# Patient Record
Sex: Female | Born: 1980 | Race: Black or African American | Hispanic: No | Marital: Single | Smoking: Never smoker
Health system: Southern US, Community
[De-identification: ages and names within clinical notes are randomized; demographics above are authoritative.]

## PROBLEM LIST (undated history)

## (undated) ENCOUNTER — Inpatient Hospital Stay (HOSPITAL_COMMUNITY): Payer: Self-pay

## (undated) DIAGNOSIS — Z8619 Personal history of other infectious and parasitic diseases: Secondary | ICD-10-CM

## (undated) DIAGNOSIS — Z86018 Personal history of other benign neoplasm: Secondary | ICD-10-CM

## (undated) DIAGNOSIS — R768 Other specified abnormal immunological findings in serum: Secondary | ICD-10-CM

## (undated) DIAGNOSIS — B009 Herpesviral infection, unspecified: Secondary | ICD-10-CM

## (undated) DIAGNOSIS — R87619 Unspecified abnormal cytological findings in specimens from cervix uteri: Secondary | ICD-10-CM

## (undated) DIAGNOSIS — IMO0002 Reserved for concepts with insufficient information to code with codable children: Secondary | ICD-10-CM

## (undated) HISTORY — DX: Personal history of other infectious and parasitic diseases: Z86.19

## (undated) HISTORY — DX: Unspecified abnormal cytological findings in specimens from cervix uteri: R87.619

## (undated) HISTORY — PX: DILATION AND CURETTAGE OF UTERUS: SHX78

## (undated) HISTORY — PX: WISDOM TOOTH EXTRACTION: SHX21

## (undated) HISTORY — DX: Other specified abnormal immunological findings in serum: R76.8

## (undated) HISTORY — DX: Personal history of other benign neoplasm: Z86.018

## (undated) HISTORY — DX: Reserved for concepts with insufficient information to code with codable children: IMO0002

## (undated) HISTORY — PX: COLPOSCOPY: SHX161

## (undated) HISTORY — DX: Herpesviral infection, unspecified: B00.9

---

## 2005-02-23 ENCOUNTER — Other Ambulatory Visit: Admission: RE | Admit: 2005-02-23 | Discharge: 2005-02-23 | Payer: Self-pay | Admitting: Obstetrics and Gynecology

## 2005-03-30 ENCOUNTER — Other Ambulatory Visit: Admission: RE | Admit: 2005-03-30 | Discharge: 2005-03-30 | Payer: Self-pay | Admitting: Obstetrics and Gynecology

## 2005-08-23 ENCOUNTER — Other Ambulatory Visit: Admission: RE | Admit: 2005-08-23 | Discharge: 2005-08-23 | Payer: Self-pay | Admitting: Obstetrics and Gynecology

## 2006-03-11 ENCOUNTER — Other Ambulatory Visit: Admission: RE | Admit: 2006-03-11 | Discharge: 2006-03-11 | Payer: Self-pay | Admitting: Obstetrics and Gynecology

## 2006-07-04 ENCOUNTER — Emergency Department (HOSPITAL_COMMUNITY): Admission: EM | Admit: 2006-07-04 | Discharge: 2006-07-05 | Payer: Self-pay | Admitting: Emergency Medicine

## 2010-07-26 ENCOUNTER — Inpatient Hospital Stay (HOSPITAL_COMMUNITY): Payer: Federal, State, Local not specified - PPO

## 2010-07-26 ENCOUNTER — Encounter (HOSPITAL_COMMUNITY): Payer: Self-pay | Admitting: Radiology

## 2010-07-26 ENCOUNTER — Inpatient Hospital Stay (HOSPITAL_COMMUNITY)
Admission: AD | Admit: 2010-07-26 | Discharge: 2010-07-26 | Disposition: A | Discharge status: Home / Self Care | Payer: Federal, State, Local not specified - PPO | Source: Ambulatory Visit | Attending: Obstetrics and Gynecology | Admitting: Obstetrics and Gynecology

## 2010-07-26 DIAGNOSIS — O209 Hemorrhage in early pregnancy, unspecified: Secondary | ICD-10-CM | POA: Insufficient documentation

## 2010-07-26 DIAGNOSIS — R58 Hemorrhage, not elsewhere classified: Secondary | ICD-10-CM

## 2010-07-26 LAB — CBC
MCH: 30.8 pg (ref 26.0–34.0)
MCV: 92.6 fL (ref 78.0–100.0)
RDW: 12.8 % (ref 11.5–15.5)

## 2010-07-26 LAB — HCG, QUANTITATIVE, PREGNANCY: hCG, Beta Chain, Quant, S: 177911 m[IU]/mL — ABNORMAL HIGH (ref <5)

## 2010-08-30 ENCOUNTER — Other Ambulatory Visit: Payer: Self-pay | Admitting: Obstetrics and Gynecology

## 2010-08-30 ENCOUNTER — Inpatient Hospital Stay (HOSPITAL_COMMUNITY): Payer: Federal, State, Local not specified - PPO

## 2010-08-30 ENCOUNTER — Ambulatory Visit (HOSPITAL_COMMUNITY)
Admission: AD | Admit: 2010-08-30 | Discharge: 2010-08-30 | Disposition: A | Discharge status: Home / Self Care | Payer: Federal, State, Local not specified - PPO | Source: Ambulatory Visit | Attending: Obstetrics and Gynecology | Admitting: Obstetrics and Gynecology

## 2010-08-30 DIAGNOSIS — O034 Incomplete spontaneous abortion without complication: Secondary | ICD-10-CM | POA: Insufficient documentation

## 2010-08-30 LAB — CBC
HCT: 33.6 % — ABNORMAL LOW (ref 36.0–46.0)
Hemoglobin: 11.1 g/dL — ABNORMAL LOW (ref 12.0–15.0)
MCHC: 33 g/dL (ref 30.0–36.0)
Platelets: 266 10*3/uL (ref 150–400)
WBC: 19 10*3/uL — ABNORMAL HIGH (ref 4.0–10.5)

## 2010-08-30 LAB — DIFFERENTIAL
Basophils Absolute: 0 10*3/uL (ref 0.0–0.1)
Basophils Relative: 0 % (ref 0–1)
Eosinophils Absolute: 0 10*3/uL (ref 0.0–0.7)
Lymphocytes Relative: 7 % — ABNORMAL LOW (ref 12–46)
Monocytes Absolute: 1 10*3/uL (ref 0.1–1.0)

## 2010-09-06 NOTE — Op Note (Signed)
NAMEJANELLI, WELLING NO.:  [id number]  MEDICAL RECORD NO.:  [id number]           PATIENT TYPE:  O  LOCATION:  [facility]                          FACILITY:  [facility]  PHYSICIAN:  Janine Limbo, M.D.DATE OF BIRTH:  09/30/1980  DATE OF PROCEDURE:  08/30/2010 DATE OF DISCHARGE:                              OPERATIVE REPORT   PREOPERATIVE DIAGNOSIS:  Retained products of conception after a twin spontaneous abortion at 14 weeks and 4 days.  POSTOPERATIVE DIAGNOSIS:  Retained products of conception after a twin spontaneous abortion at 14 weeks and 4 days.  PROCEDURE:  Suction dilatation and evacuation.  SURGEON:  Janine Limbo, MD  FIRST ASSISTANT:  None.  ANESTHETIC:  Monitored anesthetic control and paracervical block using 0.5% Marcaine with epinephrine.  DISPOSITION:  Norma Schmidt is a 30 year old female, gravida 1, para 0, who presents at 14 weeks and 4 days' gestation.  The patient has been followed at the [facility] and Gy[facility]cology Division of [facility].  The patient reports spontaneously passing both twins in the bathroom this morning.  She has had some bleeding and cramping since that time.  An ultrasound was performed and it showed retained products of conception.  The patient understands the indications for her surgical procedure as well as her alternative treatment options.  The risk and benefits of each of those options were reviewed.  The patient accepts the risks of, but not limited to, anesthetic complications, bleeding, infections, and possible damage to the surrounding organs associated with dilatation and evacuation.  FINDINGS:  The patient is blood type is B positive.  A moderate amount of placental tissue was removed from within the uterine cavity.  The uterus sounded to 12 cm.  No adnexal masses were appreciated on exam under anesthesia.  DESCRIPTION OF PROCEDURE:  The patient was taken to the  operating room where she was given medication through her IV line.  The patient's perineum and vagina were prepped with multiple layers of Betadine.  The bladder was drained of urine.  The patient was then sterilely draped.  A paracervical block was placed using 10 mL of 0.5% Marcaine with epinephrine.  The cervix was already dilated.  The uterine cavity was evacuated using a size 12 suction curette followed by a large sharp curette.  The cavity was explored with ring forceps.  The cavity was felt to be clean at the end of our procedure.  Hemostasis was adequate. All instruments were removed.  The patient's exam was repeated and the uterus was noted to be firm at approximately 10-week size.  No adnexal masses were appreciated.  Sponge and needle counts were correct.  The estimated blood loss for the procedure was 50 mL.  The patient was awakened from her anesthetic without difficulty.  The patient was transported to the recovery room in stable condition.  The products of conception (including the fetuses that were passed at home) were sent to Pathology.  FOLLOWUP INSTRUCTIONS:  The patient will return to see Dr. Stefano Gaul in 2 weeks.  She was given a copy of the postoperative instruction sheet as prepared by the  [facility] for patients who have undergone a dilatation and curettage.  She was told to call for questions or concerns.  She was given a prescription for:  1. Motrin 800 mg every 8 hours as needed for mild-to-moderate pain. 2. Vicodin 1 or 2 tablets every 4 hours as needed for severe pain. 3. Phenergan 25 mg 1 tablet every 6 hours as needed for nausea. 4. Doxycycline 100 mg twice each day for 7 days. 5. Iron 325 mg 1 tablet twice each day for 6 weeks.     Janine Limbo, M.D.     AVS/MEDQ  D:  08/30/2010  T:  08/31/2010  Job:  [id number]  Electronically Signed by Kirkland Hun M.D. on 09/06/2010 11:06:51 AM

## 2011-03-07 ENCOUNTER — Other Ambulatory Visit: Payer: Self-pay

## 2011-03-19 ENCOUNTER — Ambulatory Visit (HOSPITAL_COMMUNITY)
Admission: RE | Admit: 2011-03-19 | Discharge: 2011-03-19 | Disposition: A | Discharge status: Home / Self Care | Payer: Federal, State, Local not specified - PPO | Source: Ambulatory Visit | Attending: Obstetrics and Gynecology | Admitting: Obstetrics and Gynecology

## 2011-03-19 ENCOUNTER — Encounter (HOSPITAL_COMMUNITY): Payer: Self-pay

## 2011-03-21 NOTE — Progress Notes (Signed)
MFM Consultation Note  Ms. Norma Schmidt is a 30 year old G22P0A1 AA female at [redacted] weeks gestation who presents with a history of a spontaneous abortion of a twin gestation at 99 week and a mildly positive aCL. Ms. Norma Schmidt states that the twin pregnancy was uneventful until 10 weeks when she started bleeding almost daily. The symptoms progressed and she miscarried at 14 weeks in March of this year. A D&C was performed. She is currently at [redacted] weeks gestation and has had no complications. Along with her routine prenatal labs, aPL antibodies were obtained. The aCL IgM returned mildly positive at 17 MPL u/ml.  She denies having an arterial or venous thrombosis or any symptoms of SLE including arthritis, skin changes, thrombocytopenia or any renal compromise.   At this time, Ms. Norma Schmidt does not meet the criteria for antiphospholipid syndrome. She has had no thrombotic event or any of the following obstetrical complications: late fetal death, early severe preeclampsia, FGR or recurrent pregnancy loss (3 < 10 weeks).  Also, the aCL IgG/IgM antibodies need to be in the moderate to high range (at least > 40 units).  So, I do not believe she needs any therapy or intervention at this point. Would consider repeating the aCL testing in 12 weeks to confirm its presence in that transient elevations of aPL antibodies are common. Also suggest following fetal growth closely either clinically or by serial ultrasounds.   (Face-to-face consultation with patient: 30 min)

## 2011-05-17 ENCOUNTER — Other Ambulatory Visit (HOSPITAL_COMMUNITY): Payer: Self-pay | Admitting: Obstetrics and Gynecology

## 2011-05-17 DIAGNOSIS — IMO0002 Reserved for concepts with insufficient information to code with codable children: Secondary | ICD-10-CM

## 2011-05-18 ENCOUNTER — Ambulatory Visit (HOSPITAL_COMMUNITY)
Admission: RE | Admit: 2011-05-18 | Discharge: 2011-05-18 | Disposition: A | Discharge status: Home / Self Care | Payer: Federal, State, Local not specified - PPO | Source: Ambulatory Visit | Attending: Obstetrics and Gynecology | Admitting: Obstetrics and Gynecology

## 2011-05-18 ENCOUNTER — Ambulatory Visit (HOSPITAL_COMMUNITY): Admission: RE | Admit: 2011-05-18 | Payer: Federal, State, Local not specified - PPO | Source: Ambulatory Visit

## 2011-05-18 ENCOUNTER — Other Ambulatory Visit (HOSPITAL_COMMUNITY): Payer: Self-pay | Admitting: Obstetrics and Gynecology

## 2011-05-18 ENCOUNTER — Encounter (HOSPITAL_COMMUNITY): Payer: Self-pay

## 2011-05-18 DIAGNOSIS — IMO0002 Reserved for concepts with insufficient information to code with codable children: Secondary | ICD-10-CM

## 2011-05-18 DIAGNOSIS — Z363 Encounter for antenatal screening for malformations: Secondary | ICD-10-CM | POA: Insufficient documentation

## 2011-05-18 DIAGNOSIS — O368390 Maternal care for abnormalities of the fetal heart rate or rhythm, unspecified trimester, not applicable or unspecified: Secondary | ICD-10-CM | POA: Insufficient documentation

## 2011-05-18 DIAGNOSIS — O3440 Maternal care for other abnormalities of cervix, unspecified trimester: Secondary | ICD-10-CM | POA: Insufficient documentation

## 2011-05-18 DIAGNOSIS — O3410 Maternal care for benign tumor of corpus uteri, unspecified trimester: Secondary | ICD-10-CM | POA: Insufficient documentation

## 2011-05-18 DIAGNOSIS — O09299 Supervision of pregnancy with other poor reproductive or obstetric history, unspecified trimester: Secondary | ICD-10-CM | POA: Insufficient documentation

## 2011-05-18 DIAGNOSIS — O358XX0 Maternal care for other (suspected) fetal abnormality and damage, not applicable or unspecified: Secondary | ICD-10-CM | POA: Insufficient documentation

## 2011-05-18 DIAGNOSIS — Z1389 Encounter for screening for other disorder: Secondary | ICD-10-CM | POA: Insufficient documentation

## 2011-05-21 ENCOUNTER — Encounter (HOSPITAL_COMMUNITY): Payer: Self-pay | Admitting: *Deleted

## 2011-05-21 ENCOUNTER — Inpatient Hospital Stay (HOSPITAL_COMMUNITY)
Admission: AD | Admit: 2011-05-21 | Discharge: 2011-05-23 | DRG: 885 | Disposition: A | Discharge status: Home / Self Care | Payer: Federal, State, Local not specified - PPO | Source: Ambulatory Visit | Attending: Obstetrics and Gynecology | Admitting: Obstetrics and Gynecology

## 2011-05-21 ENCOUNTER — Encounter (HOSPITAL_COMMUNITY): Payer: Federal, State, Local not specified - PPO | Attending: Obstetrics and Gynecology

## 2011-05-21 DIAGNOSIS — O09299 Supervision of pregnancy with other poor reproductive or obstetric history, unspecified trimester: Secondary | ICD-10-CM

## 2011-05-21 DIAGNOSIS — O3430 Maternal care for cervical incompetence, unspecified trimester: Principal | ICD-10-CM | POA: Diagnosis present

## 2011-05-21 DIAGNOSIS — B009 Herpesviral infection, unspecified: Secondary | ICD-10-CM

## 2011-05-21 LAB — DIFFERENTIAL
Basophils Absolute: 0 10*3/uL (ref 0.0–0.1)
Basophils Relative: 0 % (ref 0–1)
Eosinophils Relative: 1 % (ref 0–5)
Lymphocytes Relative: 17 % (ref 12–46)
Monocytes Absolute: 0.7 10*3/uL (ref 0.1–1.0)
Neutro Abs: 8.2 10*3/uL — ABNORMAL HIGH (ref 1.7–7.7)

## 2011-05-21 LAB — CBC
MCHC: 33.9 g/dL (ref 30.0–36.0)
Platelets: 250 10*3/uL (ref 150–400)
RDW: 13.1 % (ref 11.5–15.5)
WBC: 10.9 10*3/uL — ABNORMAL HIGH (ref 4.0–10.5)

## 2011-05-21 LAB — URINALYSIS, ROUTINE W REFLEX MICROSCOPIC
Bilirubin Urine: NEGATIVE
Ketones, ur: NEGATIVE mg/dL
Leukocytes, UA: NEGATIVE
Nitrite: NEGATIVE
Specific Gravity, Urine: 1.025 (ref 1.005–1.030)
Urobilinogen, UA: 0.2 mg/dL (ref 0.0–1.0)
pH: 6 (ref 5.0–8.0)

## 2011-05-21 MED ORDER — LACTATED RINGERS IV SOLN
INTRAVENOUS | Status: DC
  Administered 2011-05-21 – 2011-05-22 (×4): via INTRAVENOUS

## 2011-05-21 MED ORDER — HYDROCORTISONE 2.5 % RE CREA
TOPICAL_CREAM | RECTAL | Status: DC | PRN
  Administered 2011-05-21: 16:00:00 via RECTAL
  Filled 2011-05-21: qty 28.35

## 2011-05-21 MED ORDER — DOCUSATE SODIUM 100 MG PO CAPS
100.0000 mg | ORAL_CAPSULE | Freq: Every day | ORAL | Status: DC
  Administered 2011-05-21 – 2011-05-23 (×2): 100 mg via ORAL
  Filled 2011-05-21 (×2): qty 1

## 2011-05-21 MED ORDER — PRENATAL PLUS 27-1 MG PO TABS
1.0000 | ORAL_TABLET | Freq: Every day | ORAL | Status: DC
  Administered 2011-05-23: 1 via ORAL
  Filled 2011-05-21: qty 1

## 2011-05-21 MED ORDER — PROGESTERONE 200 MG VA SUPP
200.0000 mg | Freq: Every day | VAGINAL | Status: DC
  Administered 2011-05-22: 200 mg via VAGINAL
  Filled 2011-05-21 (×3): qty 1

## 2011-05-21 MED ORDER — CALCIUM CARBONATE ANTACID 500 MG PO CHEW: 2.0000 | CHEWABLE_TABLET | ORAL | Status: DC | PRN

## 2011-05-21 MED ORDER — ACETAMINOPHEN 325 MG PO TABS
650.0000 mg | ORAL_TABLET | ORAL | Status: DC | PRN
  Administered 2011-05-23: 650 mg via ORAL
  Filled 2011-05-21: qty 2

## 2011-05-21 MED ORDER — ZOLPIDEM TARTRATE 10 MG PO TABS: 10.0000 mg | ORAL_TABLET | Freq: Every evening | ORAL | Status: DC | PRN

## 2011-05-21 MED ORDER — PROGESTERONE 200 MG VA SUPP
200.0000 mg | Freq: Every day | VAGINAL | Status: DC
  Filled 2011-05-21: qty 1

## 2011-05-21 NOTE — H&P (Signed)
Norma Schmidt is a 30 y.o.  female, G2P0010 at 20 weeks,  presenting for evaluation for incompetent cervix. Started care at 9 2/7 weeks. [place] dating done that day with EDC established 10/08/11. LAC, ANA were drawn and negative. ACA IgG neg, IgM slightly elevated done do to history of positive ANA with 14 week loss.  05/16/11 Korea for fetal arrythmia with no arrythmia but fhts of 99 noted and cervical length 3.06 cm.  05/18/11 [place] at MFM no arrythmia, fhts wnl, cervical length 1.5 cm. Dr. Stefano Gaul consulted and pt home on bedrest and vaginal progesterone.  05/21/11 f/o [place] office cervical length average 2.52 but dynamic cervix ranging from 1.2-3.3 cm. Digital exam by Dr. Pennie Schmidt crevix 1 cm 50%. Pt sent to the hospital for evaluation for possible cerclage. GC, CHL, GBS done in office.   Pregnancy significant for: Cervical change Hx ANA positive titer prior pg HSV Twin miscarriage 14 weeks Myoma 2.4x3.3x2.4  History OB History    Grav Para Term Preterm Abortions TAB SAB Ect Mult Living   2 0 0 0 1 0 1 0 0 0     #1--3/12.  SAB of twin pregnancy at 14 weeks.  Required D&C for retained POC. #2--Current  History reviewed. No pertinent past medical history. 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 Dilation and curettage of uterus    Family History: family history includes Alcohol abuse in her father; Arthritis in her maternal grandmother; Cancer in her father; and Diabetes in her maternal aunt. Social History:  reports that she has never smoked. She does not have any smokeless tobacco history on file. She reports that she does not drink alcohol or use illicit drugs.  ROS:  No contractions, leaking, bleeding, or any other symptom.  Mild hemorrhoid pain.    Blood pressure 101/52, pulse 86, temperature 98.4 F (36.9 C), resp. rate 16, height 5\' 10"  (1.778 m), weight 88.905 kg (196 lb), last menstrual period  12/26/2010.  Exam Physical Exam  Chest clear Heart RRR without murmur Abd gravid, NT, FH 20 week size Ext WNL, negative Homan's, no edema Pelvic--examined today in office by Dr. Pennie Schmidt.  Cervix 1 cm, 50%.  No HSV lesions or prodrome. FHR 150 by doppler  Prenatal labs: ABO, Rh: --/--/B POS (02/22 1320) Antibody:  Neg Rubella:  Immune RPR:   NR HBsAg:   Neg HIV:   NR GBS:   NA  Assessment/Plan: 20 week IUP incompetent cervix Dr. Estanislado Pandy in to see pt.   Plan: MFM consult regarding possible cerclage, GC, CHL, GBS cultures pending from office done on 12/17, labs now CBC, midstream UA, bedrest with BRP. If MFM recommends cerclage, NPO after midnight for cerclage 05/22/11 with Dr. Su Hilt.  Nigel Bridgeman 05/21/2011, 2:29 PM

## 2011-05-21 NOTE — Progress Notes (Signed)
20 week G2P1 here to evaluate need for cerclage. Ultrasound findings reviewed with patient. Patient informed we are awaiting a consult from MFM to decide if cerclage is recommended. Cerclage procedure reviewed with possible risks: bleeding, infection, accidental ROM and possible fetal loss. Questions answered.

## 2011-05-21 NOTE — Progress Notes (Signed)
MFM Note  Norma Schmidt is a 30 year old G75P0A1 AA female at 20+[redacted] weeks gestation who was admitted this AM from the office due to a dynamic, funneling cervix. Today, the cervix measured 1.2 cms - 3.3 cms. Three days ago in our office, the cervix measured 1.5 cms - 3.7 cms. Her one previous pregnancy was significant in that it was a twin gestation which ended at 14 weeks. She had been bleeding since 10-11 weeks and then miscarried at 14 weeks with cramping and bleeding. She underwent a D&E. This current pregnancy has been uneventful until last Friday.  We had a lengthy discussion regarding the published literature concerning cerclage placement. Unfortunately, the data does not suggest that women with cerclages necessarily have a better outcome than those who decide not to have a cerclage. There is some information about a better prognosis in a subgroup if women who, by ultrasound, have a cervical length of < 1.5 cms.  Her options at this point are to continue with modified bedrest and vaginal progesterone or proceed with cerclage placement. She understands the risks of the surgery including rupture of membranes and pregnancy loss. After careful consideration, Norma Schmidt has decided to have a cerclage placed.  Discussed case with Dr. Estanislado Pandy.  (Face-to-face consultation with patient: 25 min)

## 2011-05-21 NOTE — Progress Notes (Signed)
Discussed with Dr Sherrie George: after reviewing options, pt desires to proceed with cerclage. No need to await GC/Chlamydia or GBS. Pt denies any more questions at this time.

## 2011-05-21 NOTE — Plan of Care (Addendum)
Pt states office sent her for admission, no orders, Emilee Hero , CNM unaware, Emilee Hero, CNM will call office . Patient to lounge chair while direction given for her admission

## 2011-05-22 ENCOUNTER — Inpatient Hospital Stay (HOSPITAL_COMMUNITY): Payer: Federal, State, Local not specified - PPO | Admitting: Anesthesiology

## 2011-05-22 ENCOUNTER — Encounter (HOSPITAL_COMMUNITY): Payer: Self-pay | Admitting: Anesthesiology

## 2011-05-22 ENCOUNTER — Encounter (HOSPITAL_COMMUNITY)
Admission: AD | Disposition: A | Discharge status: Home / Self Care | Payer: Self-pay | Source: Ambulatory Visit | Attending: Obstetrics and Gynecology

## 2011-05-22 ENCOUNTER — Other Ambulatory Visit: Payer: Self-pay

## 2011-05-22 HISTORY — PX: CERVICAL CERCLAGE: SHX1329

## 2011-05-22 SURGERY — CERCLAGE, CERVIX, VAGINAL APPROACH
Anesthesia: Spinal | Site: Vagina | Wound class: Clean Contaminated

## 2011-05-22 MED ORDER — FENTANYL CITRATE 0.05 MG/ML IJ SOLN: 25.0000 ug | INTRAMUSCULAR | Status: DC | PRN

## 2011-05-22 MED ORDER — BUPIVACAINE IN DEXTROSE 0.75-8.25 % IT SOLN
INTRATHECAL | Status: DC | PRN
  Administered 2011-05-22: 7.5 mg via INTRATHECAL

## 2011-05-22 MED ORDER — SODIUM CHLORIDE 0.9 % IJ SOLN
3.0000 mL | Freq: Two times a day (BID) | INTRAMUSCULAR | Status: DC
  Administered 2011-05-22 – 2011-05-23 (×2): 3 mL via INTRAVENOUS

## 2011-05-22 MED ORDER — SODIUM CHLORIDE 0.9 % IJ SOLN
Freq: Once | INTRAMUSCULAR | Status: AC
  Administered 2011-05-22: 16:00:00 via VAGINAL
  Filled 2011-05-22: qty 1

## 2011-05-22 MED ORDER — CITRIC ACID-SODIUM CITRATE 334-500 MG/5ML PO SOLN
ORAL | Status: AC
  Administered 2011-05-22: 30 mL
  Filled 2011-05-22: qty 15

## 2011-05-22 SURGICAL SUPPLY — 16 items
CATH ROBINSON RED A/P 16FR (CATHETERS) IMPLANT
CLOTH BEACON ORANGE TIMEOUT ST (SAFETY) ×2 IMPLANT
COUNTER NEEDLE 1200 MAGNETIC (NEEDLE) IMPLANT
GLOVE BIO SURGEON STRL SZ7.5 (GLOVE) ×4 IMPLANT
GLOVE BIOGEL PI IND STRL 7.5 (GLOVE) ×1 IMPLANT
GLOVE BIOGEL PI INDICATOR 7.5 (GLOVE) ×1
GOWN PREVENTION PLUS LG XLONG (DISPOSABLE) ×2 IMPLANT
NS IRRIG 1000ML POUR BTL (IV SOLUTION) ×2 IMPLANT
PACK VAGINAL MINOR WOMEN LF (CUSTOM PROCEDURE TRAY) ×2 IMPLANT
PAD PREP 24X48 CUFFED NSTRL (MISCELLANEOUS) ×2 IMPLANT
SUT MERSILENE 5MM BP 1 12 (SUTURE) ×2 IMPLANT
SYR BULB IRRIGATION 50ML (SYRINGE) ×2 IMPLANT
TOWEL OR 17X24 6PK STRL BLUE (TOWEL DISPOSABLE) ×4 IMPLANT
TUBING NON-CON 1/4 X 20 CONN (TUBING) IMPLANT
WATER STERILE IRR 1000ML POUR (IV SOLUTION) ×1 IMPLANT
YANKAUER SUCT BULB TIP NO VENT (SUCTIONS) IMPLANT

## 2011-05-22 NOTE — Transfer of Care (Signed)
Immediate Anesthesia Transfer of Care Note  Patient: Norma Schmidt  Procedure(s) Performed:  CERCLAGE CERVICAL  Patient Location: PACU  Anesthesia Type: Spinal  Level of Consciousness: awake, alert  and oriented  Airway & Oxygen Therapy: Patient Spontanous Breathing  Post-op Assessment: Report given to PACU RN and Post -op Vital signs reviewed and stable  Post vital signs: Reviewed and stable  Complications: No apparent anesthesia complications

## 2011-05-22 NOTE — Anesthesia Preprocedure Evaluation (Addendum)
Anesthesia Evaluation  Patient identified by MRN, date of birth, ID band Patient awake    Reviewed: Allergy & Precautions, H&P , Patient's Chart, lab work & pertinent test results  Airway Mallampati: I TM Distance: >3 FB Neck ROM: full    Dental No notable dental hx. (+) Teeth Intact   Pulmonary  clear to auscultation  Pulmonary exam normal       Cardiovascular Exercise Tolerance: Good regular Normal    Neuro/Psych    GI/Hepatic   Endo/Other    Renal/GU      Musculoskeletal   Abdominal   Peds  Hematology   Anesthesia Other Findings   Reproductive/Obstetrics                          Anesthesia Physical Anesthesia Plan  ASA: II  Anesthesia Plan: Spinal   Post-op Pain Management:    Induction:   Airway Management Planned:   Additional Equipment:   Intra-op Plan:   Post-operative Plan:   Informed Consent: I have reviewed the patients History and Physical, chart, labs and discussed the procedure including the risks, benefits and alternatives for the proposed anesthesia with the patient or authorized representative who has indicated his/her understanding and acceptance.   Dental Advisory Given  Plan Discussed with: CRNA and Surgeon  Anesthesia Plan Comments: (Lab work confirmed with CRNA in room. Platelets okay. Discussed spinal anesthetic, and patient consents to the procedure:  included risk of possible headache,backache, failed block, allergic reaction, and nerve injury. This patient was asked if she had any questions or concerns before the procedure started. )       Anesthesia Quick Evaluation

## 2011-05-22 NOTE — Progress Notes (Signed)
Patient ID: Norma Schmidt, female   DOB: Jul 27, 1980, 30 y.o.   MRN: [id number]  Norma Schmidt is a 30 y.o. G2P0010 at [redacted]w[redacted]d admitted for incompetent cervix.  Hospital Day No: 2  Subjective: No complaints  Pregnancy complications: incomptent cervix  Objective: BP 111/61  Pulse 88  Temp(Src) 97.7 F (36.5 C) (Oral)  Resp 18  Ht 5\' 10"  (1.778 m)  Wt 88.905 kg (196 lb)  BMI 28.12 kg/m2  LMP 12/26/2010      Physical Exam:  Gen: within normal limits Chest/Lungs: cta bilaterally  Heart/Pulse: RRR  Abdomen: soft, gravid, nontender, BX x4 quad Uterine fundus: soft, nontender Skin & Color: warm and dry  Neurological: AOx3, DTRs 1+ EXT: negative Homan's b/l, edema none  FHT:  145 UC:   none SVE:    deferred (last exam 1cm)  Labs: Lab Results  Component Value Date   WBC 10.9* 05/21/2011   HGB 12.1 05/21/2011   HCT 35.7* 05/21/2011   MCV 91.8 05/21/2011   PLT 250 05/21/2011    Assessment and Plan: 30yo P0 at 20 1/7wks with incompetent cervix seen by Dr. Sherrie George of MFM and options and recommendations discussed.  Patient would like to proceed with cerclage placement.  The risks, benefits and alternatives were discussed with the patient including but not limited to bleeding, infection, injury and loss of pregnancy.  Questions answered and consent signed and witnessed.   Purcell Nails 05/22/2011, 2:53 PM

## 2011-05-22 NOTE — Anesthesia Postprocedure Evaluation (Signed)
Anesthesia Post Note  Patient: Norma Schmidt  Procedure(s) Performed:  CERCLAGE CERVICAL  Anesthesia type: Spinal  Patient location: PACU  Post pain: Pain level controlled  Post assessment: Post-op Vital signs reviewed  Last Vitals:  Filed Vitals:   05/22/11 1630  BP:   Pulse:   Temp: 36.8 C  Resp: 20    Post vital signs: Reviewed  Level of consciousness: awake  Complications: No apparent anesthesia complications

## 2011-05-22 NOTE — Anesthesia Procedure Notes (Signed)
Spinal  Patient location during procedure: OR Start time: 05/22/2011 3:06 PM Staffing Performed by: anesthesiologist  Preanesthetic Checklist Completed: patient identified, site marked, surgical consent, pre-op evaluation, timeout performed, IV checked, risks and benefits discussed and monitors and equipment checked Spinal Block Patient position: sitting Prep: site prepped and draped and DuraPrep Patient monitoring: heart rate, cardiac monitor, continuous pulse ox and blood pressure Approach: midline Location: L3-4 Injection technique: single-shot Needle Needle type: Sprotte  Needle gauge: 24 G Needle length: 9 cm Assessment Sensory level: T4 Additional Notes Clear free flow CSF on first attempt.  Patient sitting x 90 sec after placement.  Patient tolerated procedure well.

## 2011-05-22 NOTE — Op Note (Signed)
Preop Diagnosis: Incompetent Cervix   Postop Diagnosis: Incompetent Cervix   Procedure: CERCLAGE CERVICAL   Anesthesia: Spinal   Anesthesiologist: Dana Allan, MD   Attending: Osborn Coho, MD   Assistant: none  Findings: approx 2cm in length cervix and 1cm dilated.  Pathology: N/a  Fluids: 1200cc  UOP: QS via straight cath prior to procedure  EBL: Minimal  Complications: None  Procedure:Then patient was taken to the operating room after the risks, benefits and alternatives discussed with the patient and consent signed and witnessed.  The patient was given a spinal per anesthesia and placed in the dorsal lithotomy position.  A time out was performed per protocol.  The patient was prepped and draped in the usual sterile fashion.  A cervical cerclage stitch was placed using Mersilene and the knot was tied anteriorly on the cervix.  Clindamycin douche was performed.  Membranes remained intact and post procedure fetal heart rate was 141.  Sponge, lap and needle count was correct and the patient was transferred to the recovery room in good condition.

## 2011-05-23 ENCOUNTER — Encounter (HOSPITAL_COMMUNITY): Payer: Self-pay | Admitting: Obstetrics and Gynecology

## 2011-05-23 DIAGNOSIS — O09299 Supervision of pregnancy with other poor reproductive or obstetric history, unspecified trimester: Secondary | ICD-10-CM

## 2011-05-23 DIAGNOSIS — O3430 Maternal care for cervical incompetence, unspecified trimester: Secondary | ICD-10-CM | POA: Diagnosis present

## 2011-05-23 LAB — ABO/RH

## 2011-05-23 LAB — HEPATITIS B SURFACE ANTIGEN: Hepatitis B Surface Ag: NEGATIVE

## 2011-05-23 LAB — RPR: RPR: NONREACTIVE

## 2011-05-23 MED ORDER — POLYETHYLENE GLYCOL 3350 17 G PO PACK: 17.0000 g | PACK | ORAL | Status: AC

## 2011-05-23 MED ORDER — PROGESTERONE MICRONIZED 200 MG PO CAPS: 200.0000 mg | ORAL_CAPSULE | Freq: Every day | ORAL | Status: DC

## 2011-05-23 MED ORDER — HYDROCORTISONE 2.5 % RE CREA: TOPICAL_CREAM | RECTAL | Status: AC | PRN

## 2011-05-23 MED ORDER — DSS 100 MG PO CAPS: 100.0000 mg | ORAL_CAPSULE | Freq: Two times a day (BID) | ORAL | Status: AC

## 2011-05-23 MED ORDER — POLYETHYLENE GLYCOL 3350 17 G PO PACK: 17.0000 g | PACK | ORAL | Status: DC

## 2011-05-23 NOTE — Anesthesia Postprocedure Evaluation (Signed)
  Anesthesia Post-op Note  Patient: Norma Schmidt  Procedure(s) Performed:  CERCLAGE CERVICAL  Patient Location: Antenatal  Anesthesia Type: Spinal  Level of Consciousness: alert  and oriented  Airway and Oxygen Therapy: Patient Spontanous Breathing  Post-op Pain: mild  Post-op Assessment: Patient's Cardiovascular Status Stable and Respiratory Function Stable  Post-op Vital Signs: stable  Complications: No apparent anesthesia complications

## 2011-05-23 NOTE — Discharge Summary (Signed)
Obstetric Discharge Summary Reason for Admission: Incompetent cervix Prenatal Procedures: cerclage Intrapartum Procedures: na Postpartum Procedures: na Complications-Operative and Postpartum: none Hemoglobin  Date Value Range Status  05/21/2011 12.1  12.0-15.0 (g/dL) Final     HCT  Date Value Range Status  05/21/2011 35.7* 36.0-46.0 (%) Final   Hospital Course:  The patient underwent cervical cerclage without difficulty on the second hospital day.   GC,chlamydia and GBS cultures from the office were all negative.  The patient tolerated bathroom privileges well and Was deemed ready for discharge home.  Discharge Diagnoses: Incompetent cervix  Discharge Information: Date: 05/23/2011 Activity: pelvic rest and modified rest at home. Diet: routine and See med rec Medications: See med rec Condition: improved Instructions: Pelvic rest.  Up for meals, bathroom privileges. No work until determined after FU appt Discharge to: home Follow-up Information    Follow up with Purcell Nails, MD in 1 week. (Wed 05/30/11 @ 130 for ultrasound; 245 for visit    Contact information:   [address] [place] [address] [phone]            Shenekia Riess P 05/23/2011, 2:57 PM

## 2011-05-23 NOTE — Addendum Note (Signed)
Addendum  created 05/23/11 0901 by Fanny Dance   Modules edited:Notes Section

## 2011-06-05 NOTE — L&D Delivery Note (Signed)
Delivery Note At 7:00 PM a viable female, "Norma Schmidt",  was delivered via Vaginal, Spontaneous Delivery (Presentation OA: ;  ).  APGAR: 8, 9; weight 4 lb 11.8 oz (2149 g).   Placenta status: Intact, Spontaneous.  Cord: 3 vessels with the following complications: None.  Cord pH: NA NICU team at delivery--assessed baby, then allowed skin to skin.  Placenta to pathology due to IUGR and oligohydramnios.  Anesthesia: Epidural  Episiotomy: None Lacerations: 1st degree vaginal Suture Repair: 3.0 monocryl Est. Blood Loss (mL): 200  Mom to postpartum.  Baby to skin to skin, then to [facility].  Nigel Bridgeman 09/18/2011, 7:41 PM

## 2011-07-06 ENCOUNTER — Other Ambulatory Visit: Payer: Self-pay

## 2011-07-20 ENCOUNTER — Ambulatory Visit (HOSPITAL_COMMUNITY)
Admission: RE | Admit: 2011-07-20 | Discharge: 2011-07-20 | Disposition: A | Discharge status: Home / Self Care | Payer: Federal, State, Local not specified - PPO | Source: Ambulatory Visit | Attending: Obstetrics and Gynecology | Admitting: Obstetrics and Gynecology

## 2011-07-20 DIAGNOSIS — Z719 Counseling, unspecified: Secondary | ICD-10-CM

## 2011-07-29 ENCOUNTER — Inpatient Hospital Stay (HOSPITAL_COMMUNITY): Payer: Federal, State, Local not specified - PPO

## 2011-07-29 ENCOUNTER — Inpatient Hospital Stay (HOSPITAL_COMMUNITY)
Admission: AD | Admit: 2011-07-29 | Discharge: 2011-07-29 | Disposition: A | Discharge status: Home / Self Care | Payer: Federal, State, Local not specified - PPO | Source: Ambulatory Visit | Attending: Obstetrics and Gynecology | Admitting: Obstetrics and Gynecology

## 2011-07-29 DIAGNOSIS — N898 Other specified noninflammatory disorders of vagina: Secondary | ICD-10-CM | POA: Insufficient documentation

## 2011-07-29 DIAGNOSIS — O99891 Other specified diseases and conditions complicating pregnancy: Secondary | ICD-10-CM | POA: Insufficient documentation

## 2011-07-29 DIAGNOSIS — O3430 Maternal care for cervical incompetence, unspecified trimester: Secondary | ICD-10-CM | POA: Insufficient documentation

## 2011-07-29 NOTE — Progress Notes (Signed)
Pt reports having a brownish green discharge that started on Friday night. Pt on progesterone supp every night. Has not had this discharge before. Denies pain or cramping.

## 2011-07-29 NOTE — Progress Notes (Signed)
Assisted Maryln Manuel, CNM with SSE, GC & Chlamydia cultures

## 2011-07-29 NOTE — Progress Notes (Signed)
History   31yo g2p1 at 85 6/7 weeks presents with c/o of discharge brown to green x 2 days in am, using vaginal progesterone. Denies uc, vag bleeding, or leaking water discharge, with FM.  Chief Complaint  Patient presents with  . Vaginal Discharge    brownish-green discharge noted on panty-liner (none when wiping) noted Saturday AM - uses progesterone supp. q HS   @SFHPI @  OB History    Grav Para Term Preterm Abortions TAB SAB Ect Mult Living   2 0 0 0 1 0 1 0 0 0       No past medical history on file.  Past Surgical History  Procedure Date  . Dilation and curettage of uterus   . Cervical cerclage 05/22/2011    Procedure: CERCLAGE CERVICAL;  Surgeon: Purcell Nails, MD;  Location: [facility];  Service: Gynecology;  Laterality: N/A;    Family History  Problem Relation Age of Onset  . Alcohol abuse Father   . Cancer Father   . Diabetes Maternal Aunt   . Arthritis Maternal Grandmother     History  Substance Use Topics  . Smoking status: Never Smoker   . Smokeless tobacco: Not on file  . Alcohol Use: No    Allergies: No Known Allergies  Prescriptions prior to admission  Medication Sig Dispense Refill  . docusate sodium (COLACE) 100 MG capsule Take 100 mg by mouth daily.      . Prenatal Vit-Fe Fumarate-FA (PRENATAL MULTIVITAMIN) TABS Take 1 tablet by mouth daily.        . progesterone (PROMETRIUM) 200 MG capsule Place 200 mg vaginally at bedtime.      Marland Kitchen DISCONTD: progesterone (PROMETRIUM) 200 MG capsule Take 1 capsule (200 mg total) by mouth daily.  30 capsule  4  Blood pressure 136/79, pulse 121, temperature 97.9 F (36.6 C), temperature source Oral, resp. rate 20, height 5\' 10"  (1.778 m), weight 94.348 kg (208 lb), last menstrual period 12/26/2010, not currently breastfeeding. Results for orders placed during the hospital encounter of 07/29/11 (from the past 24 hour(s))  WET PREP, GENITAL     Status: Abnormal   Collection Time   07/29/11  3:45 PM      Component Value  Range   Yeast Wet Prep HPF POC NONE SEEN  NONE SEEN    Trich, Wet Prep NONE SEEN  NONE SEEN    Clue Cells Wet Prep HPF POC NONE SEEN  NONE SEEN    WBC, Wet Prep HPF POC RARE (*) NONE SEEN    Calm, cooperative, abd soft, gravid, nt, SSE small amount of white to tan discharge, strings of cerclage visibile cervix appears closed no digital exam, wet prep neg, Gc/CHL pending, for vaginal [place] Fhts 140s accels NST reactive UC without A 31 6/7 week IUP with cerclage P [place] results pending. Lavera Guise, CNM Addendum: pt states US 2 weeks ago cervical length 1.9 cm with funneling and had BMZ 4 weeks ago. Today  [place] cervical length 1 cm, cervical funneling 2.8 cm, fundal width 0.7cm Discharge home pelvic and bedrest, f/o as sheduled, collaboration with Dr. Estanislado Pandy per telephone. Lavera Guise, CNM

## 2011-07-29 NOTE — Progress Notes (Signed)
States she noted brownish-green discharge on panty-liner yesterday AM when she woke up.  Non-odorous (smells like urine?). Nothing noted on toilet paper when wiping.  Did not notice any discharge during the day, but noted spot on panty-liner again this AM.  Last intercourse was months ago.  Denies pain, or U/Cs, occasional "pressure" reported.  No vaginal bleeding.  Good fetal movement.  Uses progesterone suppositories q HS.  Cerclage placed in mid-December 2012.

## 2011-07-30 LAB — GC/CHLAMYDIA PROBE AMP, GENITAL: GC Probe Amp, Genital: NEGATIVE

## 2011-08-03 ENCOUNTER — Encounter (INDEPENDENT_AMBULATORY_CARE_PROVIDER_SITE_OTHER): Payer: Federal, State, Local not specified - PPO | Admitting: Obstetrics and Gynecology

## 2011-08-03 ENCOUNTER — Other Ambulatory Visit: Payer: Federal, State, Local not specified - PPO

## 2011-08-03 DIAGNOSIS — O3430 Maternal care for cervical incompetence, unspecified trimester: Secondary | ICD-10-CM

## 2011-08-03 DIAGNOSIS — O26849 Uterine size-date discrepancy, unspecified trimester: Secondary | ICD-10-CM

## 2011-08-09 ENCOUNTER — Encounter (INDEPENDENT_AMBULATORY_CARE_PROVIDER_SITE_OTHER): Payer: Federal, State, Local not specified - PPO | Admitting: Obstetrics and Gynecology

## 2011-08-09 DIAGNOSIS — Z331 Pregnant state, incidental: Secondary | ICD-10-CM

## 2011-08-16 ENCOUNTER — Encounter (INDEPENDENT_AMBULATORY_CARE_PROVIDER_SITE_OTHER): Payer: Federal, State, Local not specified - PPO

## 2011-08-16 ENCOUNTER — Encounter (INDEPENDENT_AMBULATORY_CARE_PROVIDER_SITE_OTHER): Payer: Federal, State, Local not specified - PPO | Admitting: Registered Nurse

## 2011-08-16 DIAGNOSIS — O26849 Uterine size-date discrepancy, unspecified trimester: Secondary | ICD-10-CM

## 2011-08-16 DIAGNOSIS — Z331 Pregnant state, incidental: Secondary | ICD-10-CM

## 2011-08-16 DIAGNOSIS — O3430 Maternal care for cervical incompetence, unspecified trimester: Secondary | ICD-10-CM

## 2011-08-17 ENCOUNTER — Encounter: Payer: Federal, State, Local not specified - PPO | Admitting: Obstetrics and Gynecology

## 2011-08-22 ENCOUNTER — Encounter (INDEPENDENT_AMBULATORY_CARE_PROVIDER_SITE_OTHER): Payer: Federal, State, Local not specified - PPO | Admitting: Obstetrics and Gynecology

## 2011-08-22 DIAGNOSIS — Z331 Pregnant state, incidental: Secondary | ICD-10-CM

## 2011-08-30 ENCOUNTER — Other Ambulatory Visit: Payer: Federal, State, Local not specified - PPO

## 2011-08-30 ENCOUNTER — Other Ambulatory Visit: Payer: Self-pay | Admitting: Obstetrics and Gynecology

## 2011-08-30 ENCOUNTER — Encounter: Payer: Federal, State, Local not specified - PPO | Admitting: Obstetrics and Gynecology

## 2011-08-30 DIAGNOSIS — O2690 Pregnancy related conditions, unspecified, unspecified trimester: Secondary | ICD-10-CM

## 2011-08-31 ENCOUNTER — Other Ambulatory Visit: Payer: Self-pay | Admitting: Obstetrics and Gynecology

## 2011-08-31 ENCOUNTER — Ambulatory Visit (HOSPITAL_COMMUNITY)
Admission: RE | Admit: 2011-08-31 | Discharge: 2011-08-31 | Disposition: A | Discharge status: Home / Self Care | Payer: Federal, State, Local not specified - PPO | Source: Ambulatory Visit | Attending: Obstetrics and Gynecology | Admitting: Obstetrics and Gynecology

## 2011-08-31 VITALS — BP 122/76 | HR 98 | Wt 220.0 lb

## 2011-08-31 DIAGNOSIS — O26879 Cervical shortening, unspecified trimester: Secondary | ICD-10-CM | POA: Insufficient documentation

## 2011-08-31 DIAGNOSIS — O2690 Pregnancy related conditions, unspecified, unspecified trimester: Secondary | ICD-10-CM

## 2011-08-31 DIAGNOSIS — O365990 Maternal care for other known or suspected poor fetal growth, unspecified trimester, not applicable or unspecified: Secondary | ICD-10-CM | POA: Insufficient documentation

## 2011-08-31 DIAGNOSIS — O09299 Supervision of pregnancy with other poor reproductive or obstetric history, unspecified trimester: Secondary | ICD-10-CM | POA: Insufficient documentation

## 2011-08-31 DIAGNOSIS — IMO0002 Reserved for concepts with insufficient information to code with codable children: Secondary | ICD-10-CM

## 2011-08-31 DIAGNOSIS — O3440 Maternal care for other abnormalities of cervix, unspecified trimester: Secondary | ICD-10-CM | POA: Insufficient documentation

## 2011-08-31 DIAGNOSIS — O3410 Maternal care for benign tumor of corpus uteri, unspecified trimester: Secondary | ICD-10-CM | POA: Insufficient documentation

## 2011-08-31 NOTE — Progress Notes (Signed)
Obstetric ultrasound performed today.   Fetal growth is at <10th percentile.  Interval growth is demonstrated on comparison with ultrasound exams in patient's primary OB office.    Amniotic fluid volume is at the lower limits of normal.  Patient denies symptoms consistent with rupture of membranes.  The fetal kidneys and bladder appear WNL.  Patient reports good fetal movement.  BPP is 8/8.   Umbilical artery Doppler measurements are WNL.    Recommend twice weekly NST with weekly AFI and weekly umbilical artery Doppler measurement.  Repeat evaluation of fetal growth in 2 weeks.  If maternal and fetal status remain stable, plan delivery at 37 weeks.  Deliver sooner for any nonreassuring findings. Precautions given.  Patient scheduled in our office on 09/04/11 for NST, AFI, and umbilical artery Doppler measurements.   Please see full report in [facility].

## 2011-09-03 ENCOUNTER — Encounter: Payer: Federal, State, Local not specified - PPO | Admitting: Obstetrics and Gynecology

## 2011-09-04 ENCOUNTER — Other Ambulatory Visit (HOSPITAL_COMMUNITY): Payer: Self-pay | Admitting: Maternal and Fetal Medicine

## 2011-09-04 ENCOUNTER — Ambulatory Visit (HOSPITAL_COMMUNITY)
Admission: RE | Admit: 2011-09-04 | Discharge: 2011-09-04 | Disposition: A | Discharge status: Home / Self Care | Payer: Federal, State, Local not specified - PPO | Source: Ambulatory Visit | Attending: Obstetrics | Admitting: Obstetrics

## 2011-09-04 ENCOUNTER — Ambulatory Visit (HOSPITAL_COMMUNITY)
Admission: RE | Admit: 2011-09-04 | Discharge: 2011-09-04 | Disposition: A | Discharge status: Home / Self Care | Payer: Federal, State, Local not specified - PPO | Source: Ambulatory Visit | Attending: Obstetrics and Gynecology | Admitting: Obstetrics and Gynecology

## 2011-09-04 VITALS — BP 118/73 | HR 90 | Wt 221.0 lb

## 2011-09-04 DIAGNOSIS — IMO0002 Reserved for concepts with insufficient information to code with codable children: Secondary | ICD-10-CM

## 2011-09-04 DIAGNOSIS — O26879 Cervical shortening, unspecified trimester: Secondary | ICD-10-CM | POA: Insufficient documentation

## 2011-09-04 DIAGNOSIS — O09299 Supervision of pregnancy with other poor reproductive or obstetric history, unspecified trimester: Secondary | ICD-10-CM | POA: Insufficient documentation

## 2011-09-04 DIAGNOSIS — O3410 Maternal care for benign tumor of corpus uteri, unspecified trimester: Secondary | ICD-10-CM | POA: Insufficient documentation

## 2011-09-04 DIAGNOSIS — O3440 Maternal care for other abnormalities of cervix, unspecified trimester: Secondary | ICD-10-CM | POA: Insufficient documentation

## 2011-09-04 DIAGNOSIS — O365990 Maternal care for other known or suspected poor fetal growth, unspecified trimester, not applicable or unspecified: Secondary | ICD-10-CM | POA: Insufficient documentation

## 2011-09-07 ENCOUNTER — Other Ambulatory Visit (INDEPENDENT_AMBULATORY_CARE_PROVIDER_SITE_OTHER): Payer: Federal, State, Local not specified - PPO

## 2011-09-07 ENCOUNTER — Ambulatory Visit (HOSPITAL_COMMUNITY): Payer: Federal, State, Local not specified - PPO

## 2011-09-07 ENCOUNTER — Encounter (INDEPENDENT_AMBULATORY_CARE_PROVIDER_SITE_OTHER): Payer: Federal, State, Local not specified - PPO | Admitting: Registered Nurse

## 2011-09-07 DIAGNOSIS — O26849 Uterine size-date discrepancy, unspecified trimester: Secondary | ICD-10-CM

## 2011-09-07 DIAGNOSIS — Z3480 Encounter for supervision of other normal pregnancy, unspecified trimester: Secondary | ICD-10-CM

## 2011-09-07 DIAGNOSIS — O09299 Supervision of pregnancy with other poor reproductive or obstetric history, unspecified trimester: Secondary | ICD-10-CM

## 2011-09-07 DIAGNOSIS — O26879 Cervical shortening, unspecified trimester: Secondary | ICD-10-CM

## 2011-09-11 ENCOUNTER — Ambulatory Visit (HOSPITAL_COMMUNITY)
Admission: RE | Admit: 2011-09-11 | Discharge: 2011-09-11 | Disposition: A | Discharge status: Home / Self Care | Payer: Federal, State, Local not specified - PPO | Source: Ambulatory Visit | Attending: Obstetrics and Gynecology | Admitting: Obstetrics and Gynecology

## 2011-09-11 DIAGNOSIS — O3440 Maternal care for other abnormalities of cervix, unspecified trimester: Secondary | ICD-10-CM | POA: Insufficient documentation

## 2011-09-11 DIAGNOSIS — O09299 Supervision of pregnancy with other poor reproductive or obstetric history, unspecified trimester: Secondary | ICD-10-CM | POA: Insufficient documentation

## 2011-09-11 DIAGNOSIS — IMO0002 Reserved for concepts with insufficient information to code with codable children: Secondary | ICD-10-CM

## 2011-09-11 DIAGNOSIS — O365990 Maternal care for other known or suspected poor fetal growth, unspecified trimester, not applicable or unspecified: Secondary | ICD-10-CM | POA: Insufficient documentation

## 2011-09-11 DIAGNOSIS — O3410 Maternal care for benign tumor of corpus uteri, unspecified trimester: Secondary | ICD-10-CM | POA: Insufficient documentation

## 2011-09-11 DIAGNOSIS — O26879 Cervical shortening, unspecified trimester: Secondary | ICD-10-CM | POA: Insufficient documentation

## 2011-09-12 NOTE — Progress Notes (Signed)
MFM follow up consultation   Ms. Norma Schmidt is a 31 year old [id number] AA female at 34 and 4/[redacted] weeks gestation.  She presents today for follow up recommendations regarding a persistent weakly positive aCL IgM.    Patient has previously been evaluated by MFM (see prior notes).  Briefly, she has a history of a spontaneous abortion of a twin gestation at 25 weeks.  Work up after this event revealed a mildly positive aCL. Ms. Norma Schmidt states that the twin pregnancy was uneventful until 10 weeks when she started bleeding almost daily. The symptoms progressed and she miscarried at 14 weeks in March of this year. A D&C was performed.  Along with her routine prenatal labs, aPL antibodies were obtained. The aCL IgM returned mildly positive at 17 MPL u/ml.   Her current pregnancy has been complicated by progressive cervical shortening and funneling in the 2nd trimester and she has undergone rescue cerclage placement.  She currently is feeling well and has no complaints.  She denies symptoms consistent with preterm labor or cervical insufficiency.  She is being treated with progesterone and has received a course of betamethasone.  Ms. Norma Schmidt denies having an arterial or venous thrombosis or any symptoms of SLE including arthritis, skin changes, thrombocytopenia or any renal compromise.   Follow up laboratory studies done 06/20/11 reveal a positive ANA with a titer too weak to report.  IgM aCl antibodies remain weakly positive with a level of 19 MPL u/ml.  LAC and other aCL antibodies are negative.    At this time, we would not recommend any change in the management of Ms. Norma Schmidt's pregnancy.  She does not meet the criteria for antiphospholipid syndrome. She has had no thrombotic event or any of the following obstetrical complications: late fetal death, early severe preeclampsia, FGR or recurrent pregnancy loss (3 < 10 weeks). Also, the aCL IgG/IgM antibodies need to be in the moderate to high range (at least > 40 units) to  meet criteria for this diagnosis.   Her ANA is likely not of clinical significance with a titer too low to measure.   Ongoing close maternal and fetal follow up is recommended with serial ultrasounds to evaluate fetal growth and amniotic fluid volume.  Repeat evaluation of aCL antibodies after 6 weeks post partum is recommended.  If findings are persistently positive at that time, referral to hematology is recommended.    Findings and recommendations discussed with the patient.  Questions answered.  Precautions given.   (Face-to-face consultation with patient: 30 min)

## 2011-09-14 ENCOUNTER — Other Ambulatory Visit: Payer: Federal, State, Local not specified - PPO

## 2011-09-14 ENCOUNTER — Ambulatory Visit (INDEPENDENT_AMBULATORY_CARE_PROVIDER_SITE_OTHER): Payer: Federal, State, Local not specified - PPO | Admitting: Obstetrics and Gynecology

## 2011-09-14 VITALS — BP 110/70 | Wt 226.0 lb

## 2011-09-14 DIAGNOSIS — O26843 Uterine size-date discrepancy, third trimester: Secondary | ICD-10-CM

## 2011-09-14 DIAGNOSIS — O1210 Gestational proteinuria, unspecified trimester: Secondary | ICD-10-CM

## 2011-09-14 DIAGNOSIS — Z331 Pregnant state, incidental: Secondary | ICD-10-CM

## 2011-09-14 DIAGNOSIS — O26839 Pregnancy related renal disease, unspecified trimester: Secondary | ICD-10-CM

## 2011-09-14 DIAGNOSIS — O26849 Uterine size-date discrepancy, unspecified trimester: Secondary | ICD-10-CM

## 2011-09-14 DIAGNOSIS — IMO0002 Reserved for concepts with insufficient information to code with codable children: Secondary | ICD-10-CM

## 2011-09-14 DIAGNOSIS — O3430 Maternal care for cervical incompetence, unspecified trimester: Secondary | ICD-10-CM

## 2011-09-14 DIAGNOSIS — N289 Disorder of kidney and ureter, unspecified: Secondary | ICD-10-CM

## 2011-09-14 DIAGNOSIS — R809 Proteinuria, unspecified: Secondary | ICD-10-CM

## 2011-09-14 LAB — CULTURE, BETA STREP (GROUP B ONLY)

## 2011-09-14 NOTE — Progress Notes (Signed)
Addended by: Janeece Agee on: 09/14/2011 04:08 PM   Modules accepted: Orders

## 2011-09-14 NOTE — Progress Notes (Signed)
   The patient  has no unusual complaints NST indication:intrauterine growth retardation NST results: reactive Last MFM visit 09/11/11: EFW < 10 th, Normal Dopplers, oligohydramnios, BPP 8/8, recommend to proceed with IOL at 37 weeks  Cerclage easily removed per protocol. GBS done.  IOL scheduled 09/18/11 at 6:30 am.Reviewed with pt including increased risk of C/S

## 2011-09-16 LAB — STREP B DNA PROBE: GBSP: NEGATIVE

## 2011-09-17 ENCOUNTER — Telehealth (HOSPITAL_COMMUNITY): Payer: Self-pay | Admitting: *Deleted

## 2011-09-17 ENCOUNTER — Encounter (HOSPITAL_COMMUNITY): Payer: Self-pay | Admitting: *Deleted

## 2011-09-17 NOTE — Telephone Encounter (Signed)
Preadmission screen  

## 2011-09-18 ENCOUNTER — Inpatient Hospital Stay (HOSPITAL_COMMUNITY)
Admission: RE | Admit: 2011-09-18 | Discharge: 2011-09-20 | DRG: 373 | Disposition: A | Discharge status: Home / Self Care | Payer: Federal, State, Local not specified - PPO | Source: Ambulatory Visit | Attending: Obstetrics and Gynecology | Admitting: Obstetrics and Gynecology

## 2011-09-18 ENCOUNTER — Encounter (HOSPITAL_COMMUNITY): Payer: Self-pay

## 2011-09-18 ENCOUNTER — Inpatient Hospital Stay (HOSPITAL_COMMUNITY): Payer: Federal, State, Local not specified - PPO | Admitting: Anesthesiology

## 2011-09-18 ENCOUNTER — Encounter (HOSPITAL_COMMUNITY): Payer: Self-pay | Admitting: Anesthesiology

## 2011-09-18 VITALS — BP 121/80 | HR 72 | Temp 97.5°F | Resp 18 | Ht 70.0 in | Wt 226.0 lb

## 2011-09-18 DIAGNOSIS — IMO0002 Reserved for concepts with insufficient information to code with codable children: Secondary | ICD-10-CM

## 2011-09-18 DIAGNOSIS — O1210 Gestational proteinuria, unspecified trimester: Secondary | ICD-10-CM

## 2011-09-18 DIAGNOSIS — O365990 Maternal care for other known or suspected poor fetal growth, unspecified trimester, not applicable or unspecified: Secondary | ICD-10-CM

## 2011-09-18 DIAGNOSIS — O3430 Maternal care for cervical incompetence, unspecified trimester: Secondary | ICD-10-CM

## 2011-09-18 DIAGNOSIS — O4100X0 Oligohydramnios, unspecified trimester, not applicable or unspecified: Secondary | ICD-10-CM | POA: Diagnosis present

## 2011-09-18 DIAGNOSIS — Z22330 Carrier of Group B streptococcus: Secondary | ICD-10-CM

## 2011-09-18 DIAGNOSIS — O99892 Other specified diseases and conditions complicating childbirth: Secondary | ICD-10-CM | POA: Diagnosis present

## 2011-09-18 LAB — CBC
Hemoglobin: 11.4 g/dL — ABNORMAL LOW (ref 12.0–15.0)
MCH: 30.4 pg (ref 26.0–34.0)
MCHC: 32.7 g/dL (ref 30.0–36.0)
Platelets: 289 10*3/uL (ref 150–400)

## 2011-09-18 LAB — ANTIBODY SCREEN: Antibody Screen: NEGATIVE

## 2011-09-18 LAB — RPR: RPR Ser Ql: NONREACTIVE

## 2011-09-18 MED ORDER — ONDANSETRON HCL 4 MG/2ML IJ SOLN: 4.0000 mg | INTRAMUSCULAR | Status: DC | PRN

## 2011-09-18 MED ORDER — OXYCODONE-ACETAMINOPHEN 5-325 MG PO TABS: 1.0000 | ORAL_TABLET | ORAL | Status: DC | PRN

## 2011-09-18 MED ORDER — TETANUS-DIPHTH-ACELL PERTUSSIS 5-2.5-18.5 LF-MCG/0.5 IM SUSP: 0.5000 mL | Freq: Once | INTRAMUSCULAR | Status: DC

## 2011-09-18 MED ORDER — IBUPROFEN 600 MG PO TABS
600.0000 mg | ORAL_TABLET | Freq: Four times a day (QID) | ORAL | Status: DC
  Administered 2011-09-19 (×4): 600 mg via ORAL
  Filled 2011-09-18 (×4): qty 1

## 2011-09-18 MED ORDER — PHENYLEPHRINE 40 MCG/ML (10ML) SYRINGE FOR IV PUSH (FOR BLOOD PRESSURE SUPPORT): 80.0000 ug | PREFILLED_SYRINGE | INTRAVENOUS | Status: DC | PRN

## 2011-09-18 MED ORDER — FENTANYL 2.5 MCG/ML BUPIVACAINE 1/10 % EPIDURAL INFUSION (WH - ANES)
INTRAMUSCULAR | Status: DC | PRN
  Administered 2011-09-18: 14 mL/h via EPIDURAL

## 2011-09-18 MED ORDER — OXYTOCIN 20 UNITS IN LACTATED RINGERS INFUSION - SIMPLE: 125.0000 mL/h | Freq: Once | INTRAVENOUS | Status: DC

## 2011-09-18 MED ORDER — SIMETHICONE 80 MG PO CHEW: 80.0000 mg | CHEWABLE_TABLET | ORAL | Status: DC | PRN

## 2011-09-18 MED ORDER — IBUPROFEN 600 MG PO TABS: 600.0000 mg | ORAL_TABLET | Freq: Four times a day (QID) | ORAL | Status: DC | PRN

## 2011-09-18 MED ORDER — BENZOCAINE-MENTHOL 20-0.5 % EX AERO
1.0000 "application " | INHALATION_SPRAY | CUTANEOUS | Status: DC | PRN
  Administered 2011-09-19: 1 via TOPICAL

## 2011-09-18 MED ORDER — CITRIC ACID-SODIUM CITRATE 334-500 MG/5ML PO SOLN: 30.0000 mL | ORAL | Status: DC | PRN

## 2011-09-18 MED ORDER — PRENATAL MULTIVITAMIN CH
1.0000 | ORAL_TABLET | Freq: Every day | ORAL | Status: DC
  Administered 2011-09-19 – 2011-09-20 (×2): 1 via ORAL
  Filled 2011-09-18 (×2): qty 1

## 2011-09-18 MED ORDER — LACTATED RINGERS IV SOLN: 500.0000 mL | INTRAVENOUS | Status: DC | PRN

## 2011-09-18 MED ORDER — BENZOCAINE-MENTHOL 20-0.5 % EX AERO
INHALATION_SPRAY | CUTANEOUS | Status: AC
  Administered 2011-09-19: 1 via TOPICAL
  Filled 2011-09-18: qty 56

## 2011-09-18 MED ORDER — OXYTOCIN 20 UNITS IN LACTATED RINGERS INFUSION - SIMPLE
1.0000 m[IU]/min | INTRAVENOUS | Status: DC
  Administered 2011-09-18: 10 m[IU]/min via INTRAVENOUS
  Administered 2011-09-18: 2 m[IU]/min via INTRAVENOUS
  Filled 2011-09-18: qty 1000

## 2011-09-18 MED ORDER — FLEET ENEMA 7-19 GM/118ML RE ENEM: 1.0000 | ENEMA | RECTAL | Status: DC | PRN

## 2011-09-18 MED ORDER — PHENYLEPHRINE 40 MCG/ML (10ML) SYRINGE FOR IV PUSH (FOR BLOOD PRESSURE SUPPORT)
80.0000 ug | PREFILLED_SYRINGE | INTRAVENOUS | Status: DC | PRN
  Filled 2011-09-18: qty 5

## 2011-09-18 MED ORDER — SENNOSIDES-DOCUSATE SODIUM 8.6-50 MG PO TABS
2.0000 | ORAL_TABLET | Freq: Every day | ORAL | Status: DC
  Administered 2011-09-18 – 2011-09-19 (×2): 2 via ORAL

## 2011-09-18 MED ORDER — WITCH HAZEL-GLYCERIN EX PADS: 1.0000 "application " | MEDICATED_PAD | CUTANEOUS | Status: DC | PRN

## 2011-09-18 MED ORDER — LANOLIN HYDROUS EX OINT: TOPICAL_OINTMENT | CUTANEOUS | Status: DC | PRN

## 2011-09-18 MED ORDER — DIBUCAINE 1 % RE OINT: 1.0000 "application " | TOPICAL_OINTMENT | RECTAL | Status: DC | PRN

## 2011-09-18 MED ORDER — LIDOCAINE HCL (PF) 1 % IJ SOLN
INTRAMUSCULAR | Status: DC | PRN
  Administered 2011-09-18 (×2): 8 mL
  Administered 2011-09-18: 30 mL

## 2011-09-18 MED ORDER — LACTATED RINGERS IV SOLN
500.0000 mL | Freq: Once | INTRAVENOUS | Status: AC
  Administered 2011-09-18: 17:00:00 via INTRAVENOUS

## 2011-09-18 MED ORDER — TERBUTALINE SULFATE 1 MG/ML IJ SOLN: 0.2500 mg | Freq: Once | INTRAMUSCULAR | Status: DC | PRN

## 2011-09-18 MED ORDER — LACTATED RINGERS IV SOLN
INTRAVENOUS | Status: DC
  Administered 2011-09-18: 100 mL/h via INTRAVENOUS

## 2011-09-18 MED ORDER — LIDOCAINE HCL (PF) 1 % IJ SOLN
30.0000 mL | INTRAMUSCULAR | Status: DC | PRN
  Filled 2011-09-18: qty 30

## 2011-09-18 MED ORDER — FENTANYL 2.5 MCG/ML BUPIVACAINE 1/10 % EPIDURAL INFUSION (WH - ANES)
14.0000 mL/h | INTRAMUSCULAR | Status: DC
  Filled 2011-09-18: qty 60

## 2011-09-18 MED ORDER — OXYTOCIN BOLUS FROM INFUSION
500.0000 mL | Freq: Once | INTRAVENOUS | Status: DC
  Filled 2011-09-18: qty 500

## 2011-09-18 MED ORDER — ONDANSETRON HCL 4 MG PO TABS: 4.0000 mg | ORAL_TABLET | ORAL | Status: DC | PRN

## 2011-09-18 MED ORDER — DIPHENHYDRAMINE HCL 50 MG/ML IJ SOLN: 12.5000 mg | INTRAMUSCULAR | Status: DC | PRN

## 2011-09-18 MED ORDER — EPHEDRINE 5 MG/ML INJ: 10.0000 mg | INTRAVENOUS | Status: DC | PRN

## 2011-09-18 MED ORDER — ZOLPIDEM TARTRATE 5 MG PO TABS: 5.0000 mg | ORAL_TABLET | Freq: Every evening | ORAL | Status: DC | PRN

## 2011-09-18 MED ORDER — EPHEDRINE 5 MG/ML INJ
10.0000 mg | INTRAVENOUS | Status: DC | PRN
  Filled 2011-09-18: qty 4

## 2011-09-18 MED ORDER — DIPHENHYDRAMINE HCL 25 MG PO CAPS: 25.0000 mg | ORAL_CAPSULE | Freq: Four times a day (QID) | ORAL | Status: DC | PRN

## 2011-09-18 MED ORDER — ACETAMINOPHEN 325 MG PO TABS: 650.0000 mg | ORAL_TABLET | ORAL | Status: DC | PRN

## 2011-09-18 MED ORDER — ONDANSETRON HCL 4 MG/2ML IJ SOLN: 4.0000 mg | Freq: Four times a day (QID) | INTRAMUSCULAR | Status: DC | PRN

## 2011-09-18 MED ORDER — LACTATED RINGERS IV SOLN
INTRAVENOUS | Status: DC
  Administered 2011-09-18: 18:00:00 via INTRAUTERINE

## 2011-09-18 NOTE — Progress Notes (Signed)
Comfortable, some cramping O VSS      fhts 125-130 LTV mod accels      uc q 3-5 mild       Abd soft, gravid, nt      Vag 3 100 -1 VTX I A 37 1/7 week IUP induction P continue care Lavera Guise, CNM

## 2011-09-18 NOTE — Progress Notes (Signed)
  Subjective: Comfortable with epidural.  Objective: BP 134/82  Pulse 79  Temp(Src) 98.5 F (36.9 C) (Oral)  Resp 18  Ht 5\' 10"  (1.778 m)  Wt 226 lb (102.513 kg)  BMI 32.43 kg/m2  SpO2 100%  LMP 12/26/2010  Breastfeeding? Unknown      FHT:  Category 1, with mild variable with UCs after AROM UC:   q 3 min, mod SVE:   Dilation: 7 Effacement (%): 100 Station: 0 Exam by:: Andren Bethea,cnm AROM--minimal amount of clear fluid with bloody show IUPC placed. Pitocin on 20 mu/min  Assessment / Plan: Progressive labor IUGR, oligo  Will do amnioinfusion and continue to observe.  Nigel Bridgeman 09/18/2011, 6:17 PM

## 2011-09-18 NOTE — H&P (Signed)
Norma Schmidt is a 31 y.o. female presenting for induction for IUGR, oligohydramnios, denies srom or vag bleeding, no swelling, with +FM, agrees to IV pitocin and amnioinfusion if indicated. 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 Problem list: see this past med hx for summary History OB History    Grav Para Term Preterm Abortions TAB SAB Ect Mult Living   2 0 0 0 1 0 1 0 0 0     3/12 twin miscarriage 14 weeks D&C Current cerclage at 20 weeks Past Medical History  Diagnosis Date  . Herpes simplex type 2 infection   . History of uterine fibroid   . History of chicken pox   . Abnormal Pap smear   . Positive ANA (antinuclear antibody)   Meds: PNV, colace, prometrium 200 mg rectally daily  Past Surgical History  Procedure Date  . Dilation and curettage of uterus   . Cervical cerclage 05/22/2011    Procedure: CERCLAGE CERVICAL;  Surgeon: Purcell Nails, MD;  Location: [facility];  Service: Gynecology;  Laterality: N/A;  . Colposcopy   . Wisdom tooth extraction    Family History: family history includes Alcohol abuse in her father; Alzheimer's disease in her maternal grandmother; Arthritis in her maternal grandmother; Birth defects in her brother; Cancer in her father and paternal grandmother; Diabetes in her maternal aunt; Heart disease in her mother; and Hypertension in her maternal aunt. Social History:  reports that she has never smoked. She has never used smokeless tobacco. She reports that she does not drink alcohol or use illicit drugs.  ROS Fhts 120s LTV min to mod UC few Dilation: 2 Effacement (%): 100 Station: -1 Exam by:: Travaris Kosh,cnm Blood pressure 135/73, pulse 93, temperature 97.5 F (36.4 C), temperature source Oral, resp. rate 16, height 5\' 10"  (1.778 m), weight 226 lb (102.513 kg), last menstrual period 12/26/2010, unknown if currently breastfeeding. Exam Physical Exam Calm, no distress, lungs clear bilaterally, AP RRR, abd soft, gravid, nt, bowel sounds  active no edema to lower extremities, EGBUS perineum wnl SSE no HSV lesions Prenatal labs: ABO, Rh: B/Positive/-- (12/19 0000) Antibody: Negative (04/16 0640) Rubella: Immune (12/19 0000) RPR: Nonreactive (12/19 0000)  HBsAg: Negative (12/19 0000)  HIV: Non-reactive (12/19 0000)  GBS: NEGATIVE (04/12 1609)   Assessment/Plan: 37 1/7 week IUP IUGR  +ANA  Oligohydramnios P IV pitocin, discussed amnioinfusion if indicated and pt agrees with plan of care. Collaboration with Dr. Stefano Gaul per telephone.  Norma Schmidt 09/18/2011, 8:16 AM

## 2011-09-18 NOTE — Anesthesia Preprocedure Evaluation (Signed)
Anesthesia Evaluation  Patient identified by MRN, date of birth, ID band Patient awake    Reviewed: Allergy & Precautions, H&P , NPO status , Patient's Chart, lab work & pertinent test results  Airway Mallampati: II TM Distance: >3 FB Neck ROM: full    Dental No notable dental hx.    Pulmonary neg pulmonary ROS,  breath sounds clear to auscultation  Pulmonary exam normal       Cardiovascular negative cardio ROS      Neuro/Psych negative neurological ROS  negative psych ROS   GI/Hepatic negative GI ROS, Neg liver ROS,   Endo/Other  negative endocrine ROS  Renal/GU negative Renal ROS  negative genitourinary   Musculoskeletal negative musculoskeletal ROS (+)   Abdominal Normal abdominal exam  (+)   Peds negative pediatric ROS (+)  Hematology negative hematology ROS (+)   Anesthesia Other Findings   Reproductive/Obstetrics (+) Pregnancy                           Anesthesia Physical Anesthesia Plan  ASA: II  Anesthesia Plan: Epidural   Post-op Pain Management:    Induction:   Airway Management Planned:   Additional Equipment:   Intra-op Plan:   Post-operative Plan:   Informed Consent: I have reviewed the patients History and Physical, chart, labs and discussed the procedure including the risks, benefits and alternatives for the proposed anesthesia with the patient or authorized representative who has indicated his/her understanding and acceptance.     Plan Discussed with:   Anesthesia Plan Comments:         Anesthesia Quick Evaluation  

## 2011-09-18 NOTE — Progress Notes (Signed)
Asleep O Fhts 120 LTV mod accels     uc q 2-5     Vag not assessed A 37 1/7 week IUP    Oligo, IUGR P continue pitocin Lavera Guise, CNM

## 2011-09-18 NOTE — Progress Notes (Signed)
  Subjective: Aware of some contractions as mildly painful.  Family at bedside.  Objective: BP 129/72  Pulse 86  Temp(Src) 98.5 F (36.9 C) (Oral)  Resp 18  Ht 5\' 10"  (1.778 m)  Wt 226 lb (102.513 kg)  BMI 32.43 kg/m2  SpO2 100%  LMP 12/26/2010  Breastfeeding? Unknown      FHT:  Category 1 UC:   q3-6 min, mild SVE:   Dilation: 3 Effacement (%): 100 Station: -1 Exam by:: Sigmund Morera, cnm Slight BBOW.  Assessment / Plan: Induction at 37 1/7 weeks for IUGR, oligo. Early labor Positive GBS  Reviewed status with patient. Per her preference, will place epidural and then plan AROM.  Nigel Bridgeman 09/18/2011, 1630

## 2011-09-18 NOTE — Anesthesia Procedure Notes (Signed)
Epidural Patient location during procedure: OB Start time: 09/18/2011 4:59 PM End time: 09/18/2011 5:09 PM Reason for block: procedure for pain  Staffing Anesthesiologist: Sandrea Hughs  Preanesthetic Checklist Completed: patient identified, site marked, surgical consent, pre-op evaluation, timeout performed, IV checked, risks and benefits discussed and monitors and equipment checked  Epidural Patient position: sitting Prep: site prepped and draped and DuraPrep Patient monitoring: continuous pulse ox and blood pressure Approach: midline Injection technique: LOR air  Needle:  Needle type: Tuohy  Needle gauge: 17 G Needle length: 9 cm Needle insertion depth: 8 cm Catheter type: closed end flexible Catheter size: 19 Gauge Catheter at skin depth: 13 cm Test dose: negative and Other  Assessment Sensory level: T10 Events: blood not aspirated, injection not painful, no injection resistance, negative IV test and no paresthesia

## 2011-09-19 ENCOUNTER — Encounter: Payer: Self-pay | Admitting: Obstetrics and Gynecology

## 2011-09-19 LAB — CBC
HCT: 34.3 % — ABNORMAL LOW (ref 36.0–46.0)
Hemoglobin: 10.9 g/dL — ABNORMAL LOW (ref 12.0–15.0)
Platelets: 257 10*3/uL (ref 150–400)
RDW: 13.5 % (ref 11.5–15.5)

## 2011-09-19 MED ORDER — IBUPROFEN 600 MG PO TABS
600.0000 mg | ORAL_TABLET | Freq: Four times a day (QID) | ORAL | Status: DC
  Administered 2011-09-20 (×3): 600 mg via ORAL
  Filled 2011-09-19 (×3): qty 1

## 2011-09-19 NOTE — Anesthesia Postprocedure Evaluation (Signed)
  Anesthesia Post Note  Patient: Norma Schmidt  Procedure(s) Performed: * No procedures listed *  Anesthesia type: Epidural  Patient location: Mother/Baby  Post pain: Pain level controlled  Post assessment: Post-op Vital signs reviewed  Last Vitals:  Filed Vitals:   09/19/11 0500  BP: 127/85  Pulse: 77  Temp: 36.6 C  Resp: 20    Post vital signs: Reviewed  Level of consciousness:alert  Complications: No apparent anesthesia complications

## 2011-09-19 NOTE — Progress Notes (Signed)
S: comfortable, little bleeding, slept some breast     Feeding, undecided about birth control O VSS     abd soft, nt, ff      sm  flowperineum clean intact     -Homans sign bilaterally,          edema A normal involution     Lactating     PP day 1  P birth control options reviewed, continue care Lavera Guise, CNM

## 2011-09-19 NOTE — Progress Notes (Signed)
Came by to see patient--she is doing well, and her infant is also doing well and present with her. VSS. Afebrile Patient requested notes verifying delivery and establishing length of maternity leave. Notes written and filed in office chart.  Nigel Bridgeman, CNM, NM 4/17/123 7:45p

## 2011-09-20 ENCOUNTER — Encounter (HOSPITAL_COMMUNITY)
Admission: RE | Admit: 2011-09-20 | Discharge: 2011-09-20 | Disposition: A | Discharge status: Home / Self Care | Payer: Federal, State, Local not specified - PPO | Source: Ambulatory Visit | Attending: Obstetrics and Gynecology | Admitting: Obstetrics and Gynecology

## 2011-09-20 MED ORDER — IBUPROFEN 600 MG PO TABS: 600.0000 mg | ORAL_TABLET | Freq: Four times a day (QID) | ORAL | Status: AC | PRN

## 2011-09-20 NOTE — Discharge Summary (Signed)
Obstetric Discharge Summary Reason for Admission: induction of labor Prenatal Procedures: NST and ultrasound Intrapartum Procedures: spontaneous vaginal delivery and amnioinfusion; epidural. Postpartum Procedures: none Complications-Operative and Postpartum: 1st degree perineal laceration Hemoglobin  Date Value Range Status  09/19/2011 10.9* 12.0-15.0 (g/dL) Final     HCT  Date Value Range Status  09/19/2011 34.3* 36.0-46.0 (%) Final  Hospital course:  Pt admitted at 37.1 weeks on morning of 09/18/11 for IOL secondary to Oligohydramnios and IUGR.  Cx on admission=2/100/-1 and Pitocin started around 0830.  Pt denied any HSV s/s, but was not on Valtrex prophylaxis.  At 1313, cx=3/100/-1.  Pt desired epidural placement before AROM, and received around 1650.  AROM at 1812 after comfortable and cx=7/100/0.  Mild variables noted after AROM and amnioinfusion was started; Pitocin on 20mu at that time.  Labor progressed quickly thereafter and cx complete at 1850 w/ subsequent delivery at 1900.   Newborn has remained w/ pt during PP stay, and both mom and baby's PP recovery have been unremarkable.  BF'ng established and pt up ad lib.  She is tol diet, voiding well, and minimal pain which has been controlled w/ Motrin.  Pt was deemed to have received full benefit of hospital stay, and d/c'd home per c/w Dr. Normand Sloop in stable condition.  Abstinence until 6 wk PP check.    Physical Exam:  General: alert, cooperative, no distress and pleasant Lochia: appropriate Uterine Fundus: firm, below umbilicus Incision: n/a DVT Evaluation: No evidence of DVT seen on physical exam. Negative Homan's sign. No significant calf/ankle edema.  Discharge Diagnoses: Term Pregnancy-delivered and Lactating.  IUGR and Oligohydramnios.  h/o positive ANA, abnl Pap, and HSV, fibroids.  Cerclage this pregnancy secondary to previous loss of twins at 14 weeks.  Discharge Information: Date: 09/20/2011 PPD#2 Activity: pelvic  rest Diet: routine Medications: PNV, Ibuprofen and Colace Condition: stable Instructions: refer to practice specific booklet Discharge to: home Follow-up Information    Follow up with [facility]. Schedule an appointment as soon as possible for a visit in 6 weeks. (or call as needed w/ any questions or concerns)          Newborn Data: Live born female "Norma Schmidt"  (delivery provider:  Nigel Bridgeman, CNM) Birth Weight: 4 lb 11.8 oz (2149 g) APGAR: 8, 9  Home with mother.  Deidrick Rainey H 09/20/2011, 10:32 AM

## 2011-09-20 NOTE — Discharge Instructions (Signed)
Postpartum Care After Vaginal Delivery After you deliver your baby, you will stay in the hospital for 24 to 72 hours, unless there were problems with the labor or delivery, or you have medical problems. While you are in the hospital, you will receive help and instructions on how to care for yourself and your baby. Your doctor will order pain medicine, in case you need it. You will have a small amount of bleeding from your vagina and should change your sanitary pad frequently. Wash your hands thoroughly with soap and water for at least 20 seconds after changing pads and using the toilet. Let the nurses know if you begin to pass blood clots or your bleeding increases. Do not flush blood clots down the toilet before having the nurse look at them, to make sure there is no placental tissue with them. If you had an intravenous (IV), it will be removed within 24 hours, if there are no problems. The first time you get out of bed or take a shower, call the nurse to help you because you may get weak, lightheaded, or even faint. If you are breastfeeding, you may feel painful contractions of your uterus for a couple of weeks. This is normal. The contractions help your uterus get back to normal size. If you are not breastfeeding, wear a supportive bra and handle your breasts as little as possible until your milk has dried up. Hormones should not be given to dry up the breasts, because they can cause blood clots. You will be given your normal diet, unless you have diabetes or other medical problems.  The nurses may put an ice pack on your episiotomy (surgically enlarged opening), if you have one, to reduce the pain and swelling. On rare occasions, you may not be able to urinate and the nurse will need to empty your bladder with a catheter. If you had a postpartum tubal ligation ("tying tubes," female sterilization), it should not make your stay in the hospital longer. You may have your baby in your room with you as much as  you like, unless you or the baby has a problem. Use the bassinet (basket) for the baby when going to and from the nursery. Do not carry the baby. Do not leave the postpartum area. If the mother is Rh negative (lacks a protein on the red blood cells) and the baby is Rh positive, the mother should get a Rho-gam shot to prevent Rh problems with future pregnancies. You may be given written instructions for you and your baby, and necessary medicines, when you are discharged from the hospital. Be sure you understand and follow the instructions as advised. HOME CARE INSTRUCTIONS   Follow instructions and take the medicines given to you.   Only take over-the-counter or prescription medicines for pain, discomfort, or fever as directed by your caregiver.   Do not take aspirin, because it can cause bleeding.   Increase your activities a little bit every day to build up your strength and endurance.   Do not drink alcohol, especially if you are breastfeeding or taking pain medicine.   Take your temperature twice a day and record it.   You may have a small amount of bleeding or spotting for 2 to 4 weeks. This is normal.   Do not use tampons or douche. Use sanitary pads.   Try to have someone stay and help you for a few days when you go home.   Try to rest or take a nap when   the baby is sleeping.   If you are breastfeeding, wear a good support bra. If you are not breastfeeding, wear a supportive bra and do not stimulate your nipples.   Eat a healthy, nutritious diet and continue to take your prenatal vitamins.   Do not drive, do any heavy activities, or travel until your caregiver tells you it is okay.   Do not have intercourse until your caregiver gives you permission to do so.   Ask your caregiver when you can begin to exercise and what type of exercises to do.   Call your caregiver if you think you are having a problem from your delivery.   Call your pediatrician if you are having a problem  with the baby.   Schedule your postpartum visit and keep it.  SEEK MEDICAL CARE IF:   You have a temperature of 100 F (37.8 C) or higher.   You have increased vaginal bleeding or are passing clots. Save any clots to show your caregiver.   You have bloody urine or pain when you urinate.   You have a bad smelling vaginal discharge.   You have increasing pain or swelling on your episiotomy.   You develop a severe headache.   You feel depressed.   The episiotomy is separating.   You become dizzy or lightheaded.   You develop a rash.   You have a reaction or problems with your medicine.   You have pain, redness, or swelling at the intravenous site.  SEEK IMMEDIATE MEDICAL CARE IF:   You have chest pain.   You develop shortness of breath.   You pass out.   You develop pain, with or without swelling or redness in your leg.   You develop heavy vaginal bleeding, with or without blood clots.   You develop stomach pain.   You develop a bad smelling vaginal discharge.  MAKE SURE YOU:   Understand these instructions.   Will watch your condition.   Will get help right away if you are not doing well or get worse.  Document Released: 03/18/2007 Document Revised: 05/10/2011 Document Reviewed: 03/30/2009 [employer] Patient Information 2012 [employer]  Breastfeeding Challenges Breastfeeding is often the best way to feed your baby. Challenges may discourage you from breastfeeding. But solutions can usually be found to help you. Some babies have conditions that may interfere with or make breastfeeding more difficult. But, in all of the following cases, breastfeeding is still best for a baby's health.  ADVANTAGES OF BREASTFEEDING  Breastfed babies tend to be healthier and less affected by disease.   Breastfed babies may have better brain development and be less likely to be overweight than formulafed babies.   Breastfeeding also benefits the mother. It will give you time  to be close to your baby and helps create a strong bond. It also:   Delays the return of your periods.   Stimulates your uterus to contract back to normal.   Helps you lose some of the weight you gained during pregnancy.   Breastfeeding is also cheaper than formula feeding. It also does not require mixing formula, heating bottles, or washing extra dishes.   Breastfeeding mothers have a lower risk of developing breast cancer.   Breastfeeding should be encouraged in women with gestational diabetes and diabetes type I and type II.  BREASTFEEDING CHALLENGES  Breastfeeding involves taking the time to get to know your baby's patterns and responding to his or her cues. Once breastfeeding is well established, feedings usually   become more regular and more widely spaced. Some mothers do not nurse their babies because they come across problems early on. If at all possible, begin breastfeeding your baby within an hour after delivery. The first milk you produce is called colostrum. It is packed with nutrients and disease-fighting substances. These will help nourish and protect your baby against infections as he or she grows up.   Some babies are unable to breastfeed because of premature birth and small size along with weakness and difficulty sucking. Sometimes with birth defects of the mouth (cleft lip or cleft palate) the mother may be able to pump breastmilk to give to her baby. Some digestive problems (breast milk jaundice, galactosemia) may be reasons not to nurse. See a lactation consultant if you have a breast infection or breast abscess, breast cancer or other cancer, previous surgery or radiation treatment, or inadequate milk supply (uncommon).  SOME MOTHERS ARE ADVISED NOT TO BREASTFEED DUE TO HEALTH PROBLEMS SUCH AS:  Serious illnesses.   Severe malnutrition.   Undergoing radiation therapy.   Taking psychiatric medication.   Active herpes lesions on the breast.   Chickenpox or shingles.     Active, untreated tuberculosis.   HIV (human immunodeficiency virus) infection.   Drug or alcohol addiction.   Undergoing radioactive iodine therapy.   Leukemia human cell Type I or II.  BREASTFEEDING SHOULD NOT BE PAINFUL  It is natural for minor problems to arise in first time breastfeeding. Problems you may have and some solutions are as follows:   Nipple soreness may be caused by:   Improper position of baby.   Improper feeding techniques.   Improper nipple care.   For many women, there is no identified cause. A simple change in your baby's position while feeding may relieve nipple soreness. Some breastfeeding mothers report nipple soreness only during the initial adjustment period.   If there is tenderness at first, it should gradually go away as the days go by. Poor latch-on and positioning are common causes of sore nipples. This is usually because the baby is not getting enough of the areola into his or her mouth, and is sucking mostly on the nipple. The areola is the colored portion around the nipple. In general, nurse early and often. Nurse with the nipple and areola in the baby's mouth, not just the nipple. And feed your baby on demand.   If you have sore nipples and put off feedings because of the pain, this can lead to your breasts becoming overly full. This may lead to plugged milk ducts in the breast followed by engorgement or even infection of the breasts. If your baby is latched on correctly, he or she should be able to nurse as long as needed without causing any pain. If it hurts, take the baby off of your breast and try again. Ask for help if it is still painful for you.   Check the positioning of your baby's body and the way she latches on and sucks. To minimize soreness, your baby's mouth should be open wide with as much of the areola in his or her mouth as possible. You should find that it feels better right away once the baby is positioned correctly.   Do not  delay feedings. Try to relax so your let-down reflex comes easily. You also can hand-express a little milk before beginning the feeding so your baby does not clamp down harder, waiting for the milk to come.   If your nipples are very   sore, it may be helpful to change positions each time you nurse. This puts the pressure on a different part of the nipple.   After nursing, you can also express a few drops of milk and gently rub it on your nipples. Human milk has natural healing properties. Let your nipples air-dry after feeding, or wear a soft-cotton shirt.   Wearing a nipple shield during nursing will not relieve sore nipples. They actually can prolong soreness by making it hard for the baby to learn to nurse without the shield.   Avoid wearing bras or clothes that are too tight and put pressure on your nipples. If you wear a bra, get one that offers good support to your breasts.   Change nursing pads often to avoid trapping in moisture. Only use cotton pads.   Avoid using soap, ointments or other chemicals on your nipples. Make sure to avoid products that must be removed before nursing. Washing with clean water is all that is necessary to keep your nipples and breasts clean.   Try rubbing pure lanolin on your nipples after breastfeeding to soothe the pain.   Making sure you get enough rest, eating healthy foods, and getting enough fluids also can help the healing process. If you have very sore nipples, you can ask your caregiver about using non-aspirin pain relievers.   Another cause of sore nursing is a condition called thrush. This is a fungal infection that can form on your nipples from the milk. Other signs of thrush include itching, flaking and drying skin, tender or pink skin. The infection also can form in the baby's mouth from having contact with your nipples. There it appears as little white spots on the inside of the cheeks, gums, or tongue. It also can appear as a diaper rash on your  baby that will not go away by using regular diaper rash ointments. If you have any of these symptoms or think you have thrush, contact your doctor and your baby's doctor, or a lactation consultant. A lactation consultant is a breastfeeding consultant or expert. You can get medication for your nipples and for your baby.   If you still have sore nipples after following the above tips, you may need to see someone who is trained in breastfeeding, like a lactation consultant.  ENGORGEMENT Engorgement is a condition after pregnancy, when your breasts feel very hard and painful. You also may have breast swelling, tenderness, warmth, redness, throbbing and flattening of the nipple. Engorgement may cause a low-grade fever. This can be confused with a breast infection. Engorgement is the result of the milk building up. It usually happens during the third to fifth day after birth. This slows circulation. When blood and lymph move through the breasts, fluid from the blood vessels can seep into the breast tissues. All of the following can cause engorgement.  Poor positioning.   Infrequent feedings.   Giving supplementary bottles of water, juice, formula, or breast milk or using a pacifier. All of these cut down on your feeding and may lead to engorgement.   Changing the breastfeeding schedule with decreasing in feeding.   The baby changes the nursing pattern.   Having a baby with a weak suck who is not able to nurse effectively.   Fatigue, stress, or anemia in the mother.   An overabundant milk supply.   Nipple damage.   Breast abnormalities.  Engorgement can lead to plugged ducts or a breast infection. So it is important to try to prevent   it before this happens. If treated properly, engorgement should only last for one to two days.  Minimize engorgement by making sure the baby is latched on and positioned correctly at your breast.   Nurse frequently after birth. Allow the baby to nurse as long as  he/she likes, as long as he/she is latched on well and sucking effectively.   In the early days when your milk is coming in, you should awaken a sleepy baby every 2 to 3 hours to breastfeed. Breastfeeding often on the affected side helps to remove the milk and keeps milk moving freely. This prevents overfilling of the breast.   Avoid additional bottles and pacifiers.   Try hand expressing or pumping a little milk to first soften the breast, areola, and nipple before breastfeeding. Or massage the breast before feeding.   Cold compresses in between feedings can help ease pain and swelling.   If you are returning to work, try to pump your milk on the same schedule your baby was fed.   Eat a well balanced diet and drink plenty of fluids.   Use a well-fitting, supportive bra that is not too tight.   If your engorgement lasts for more than two days even after treating it, contact a lactation consultant.   Use a breast pump to keep up with your nursing schedule.   Use a breast pump if your baby is not taking enough milk or you feel you may be getting engorged.  PLUGGED DUCTS AND INFECTION Plugged ducts and breast infection (mastitis) are common sources of sore breasts postpartum. It is common for many women to have a plugged duct in the breast at some point if breastfeeding.   A plugged milk duct feels like a tender, sore, lump in the breast. It is not accompanied by a fever or other symptoms. It happens when a milk duct does not properly drain. Then, pressure builds up behind the plug, and surrounding tissue becomes inflamed. A plugged duct usually only occurs in one breast at a time.   A breast infection (mastitis), on the other hand, is soreness or a lump in the breast that can be accompanied by a fever and/or flu-like symptoms. You may feel run down or very achy. Some women with a breast infection also have nausea and vomiting. You also may have yellowish discharge from the nipple that looks  like colostrum. Or the breasts feel warm or hot to the touch and appear pink or red. A breast infection can occur when other family members have a cold or the flu. Like a plugged duct, it usually only occurs in one breast. It is not always easy to tell the difference between a breast infection and a plugged duct. Both have similar symptoms and can improve within 24 to 48 hours.   Treatment for plugged ducts and breast infections is similar. But some breast infections need to also be treated with an antibiotic.   If mastitis is not treated quickly, it may lead to a breast abscess.   It may help to massage the area, starting behind the sore spot. Use your fingers in a circular motion and massage toward the nipple.   Breastfeed more often on the affected side. This helps loosen the plug, keeps the milk moving freely, and the breast from becoming overly full. Nursing every two hours, both day and night on the affected side first can be helpful.   Getting extra sleep or relaxing with your feet up can help speed   healing. Often a plugged duct or breast infection is the first sign that a mother is doing too much and becoming overly tired.   Wear a well-fitting supportive bra that is not too tight, since this can constrict milk ducts.   If you do not feel better within 24 hours of trying these steps, and you have a fever or your symptoms worsen, call your doctor. You may need an antibiotic. Also, if you have a breast infection in which both breasts look affected, or there is pus or blood in the milk, red streaks near the area, or your symptoms came on severely and suddenly, see your doctor right away.   Even if you need an antibiotic, continuing to breastfeed during treatment is best for both you and your baby. Most antibiotics will not affect your baby through your breast milk.  THRUSH Thrush (yeast) is a fungal infection that can form on your nipples or in your breast because it thrives on milk. The  infection forms from an overgrowth of the candida organism. Candida usually exists in our bodies and is kept at healthy levels by the natural bacteria in our bodies. But, when the natural balance of bacteria is upset, candida can overgrow, causing an infection. Some of the things that can cause thrush include:   Having an overly moist environment on your skin or nipples that are sore or cracked.   Taking antibiotics, birth control pills or steroids.   Having a diet that contains large amounts of sugar or foods with yeast.   Having a chronic illness like HIV infection, diabetes, or anemia.  If you have sore nipples that last more than a few days even after you make sure your baby's latch and positioning is correct, or you suddenly get sore nipples after several weeks of unpainful nursing, you could have thrush. Some other signs of thrush include:   Pink, flaky, shiny, itchy or cracked nipples.   Deep pink and blistered nipples.   You also could have shooting pains deep in the breast during or after feedings, or achy breasts.  The infection also can form in your baby's mouth from having contact with your nipples, and appear as little white spots on the inside of the cheeks, gums, or tongue. It also can appear as a diaper rash (small red dots around a rash) on your baby that will not go away by using regular diaper rash ointments. Many babies with thrush refuse to nurse, or are gassy or cranky.  Solution:   If you or your baby have any of these symptoms, contact your doctor and your baby's doctor so you both can be correctly diagnosed.   You can get medication for your nipples and for your baby. Medication for a mother is usually an ointment for the nipples. Your baby can be given a liquid medication for his/her mouth, and/or an ointment for any diaper rash.   Change disposable nursing pads often. Wash any towels or clothing that come in contact with the yeast in very hot water (above 122 F or  50 C).   Wear a clean bra every day. Wash your hands often, and wash your baby's hands often, especially if he or she sucks on his/her fingers.   Only use cotton pads.   Boil any pacifiers, bottle nipples, or toys your baby puts in his or her mouth once a day for 20 minutes to kill the thrush. After one week of treatment, discard pacifiers and nipples and buy new   ones.   Boil daily for 20 minutes all breast pump parts that touch the milk.   Make sure other family members are free of thrush or other fungal infections. If they have symptoms, get them treatment.  NURSING STRIKE A nursing strike is when your baby has been nursing well for months, then suddenly loses interest in breastfeeding and begins to refuse the breast. A nursing strike can mean several things are happening with your baby and that she or he is trying to communicate with you to let you know that something is wrong. Not all babies will react the same to different situations that can cause a nursing strike. Some will continue to breastfeed without a problem, others may just become fussy at the breast, and others will refuse the breast entirely. Some of the major causes of a nursing strike include:  Mouth pain from teething, a fungal infection, or a cold sore.   An ear infection.   Pain from a certain nursing position.   Being upset about a long separation from the mother or a major change in routine.   Being interested in other things around him or her.   A cold or stuffy nose that makes breathing difficult.   Reduced milk supply from supplementing with bottles or overuse of a pacifier.   Responding to the mother's strong reaction if the baby has bitten her.   Being upset about hearing arguing or people talking in a harsh voice with other family members while nursing.   Reacting to stress, over-stimulation, or having been repeatedly put off when wanting to nurse.   If your baby is on a nursing strike, it is normal to  feel frustrated and upset, especially if your baby is unhappy. It is important not to feel guilty or that you have done something wrong. Your breasts also may become uncomfortable as the milk builds up.  Treatment  Try to express your milk manually or with a breast pump on the same schedule as the baby used to breastfeed to avoid engorgement and plugged ducts.   Try another feeding method temporarily to give your baby your milk, such as a cup, dropper, or spoon. Keep track of your baby's wet diapers to make sure he/she is getting enough milk (5 to 6 per day).   Keep offering your breast to the baby. If the baby is frustrated, stop and try again later. Try when the baby is sleeping or very sleepy.   Try various breastfeeding positions.   Focus on the baby with all of your attention and comfort him or her with extra touching and cuddling.   Try nursing while rocking and in a quiet room free of distractions.  INVERTED OR LARGE NIPPLES Some women have nipples that naturally are inverted, or that turn inward instead of protruding, or that are flat and do not protrude. Inverted or flat nipples can sometimes make it harder to breastfeed because your baby can have a harder time latching on. But remember that for breastfeeding to work, your baby has to latch on to both the nipple and the breast, so even inverted nipples can work just fine. Very large nipples can make it hard for the baby to get enough of the areola into his or her mouth to compress the milk ducts and get enough milk.  Know what type of nipples you have before you have your baby, so you can be prepared in case you have a problem getting your baby to latch on   correctly.   Talk with a lactation consultant at the hospital or at a breastfeeding clinic for extra help if you have flat, inverted, or very large nipples.   Sometimes a lactation consultant can help inverted nipples to be pulled out with a small device before your baby is brought to  your breast.   In many cases, inverted nipples will protrude more as the baby starts to latch on and as time passes. The baby's sucking will help.   Flat nipples cause fewer problems than inverted nipples. Good latch-on and positioning are usually enough to ensure that a baby latched to a flat nipple breastfeeds well.   The latch for babies of mothers with very large nipples will improve with time as the baby grows. In some cases, it might take several weeks to get the baby to latch well. A good milk supply helps insure that her baby will get enough milk.  RETURNING TO WORK More and more women are breastfeeding when they return to work because they believe in the benefits of breastfeeding. You can purchase or rent effective breast pumps and storage containers for your milk. Many employers are willing to set up special rooms for mothers who pump. But others are not as educated about the benefits of breastfeeding. Also, many women are not able to take off as much time as they'd like after having their babies and might have to return to work before breastfeeding is well established. After you have your baby, take as much time off as possible. This will help you get your breastfeeding established well and may reduce the number of months you may need to pump your milk while you are at work.   If you plan to have your baby take a bottle of expressed breast milk while you are at work, you can introduce your baby to a bottle when he or she is around four weeks old. Otherwise, the baby might not accept the bottle later on. Once your baby is comfortable taking a bottle, it is a good idea to have Dad or another family member offer a bottle of pumped breast milk on a regular basis so the baby stays in practice.   Let your employer or human resources manager know that you plan to continue breastfeeding once you return to work. Before you return to work, or even before you have your baby, start talking with your  employer about breastfeeding. Do not be afraid to request a clean and private area where you can pump your milk. If you do not have your own office space, you can ask to use a supervisor's office during certain times. Or you can ask to have a clean, clutter free corner of a storage room. All you need is a chair, a small table, and an outlet if you are using an electric pump. Many electric pumps also can run on batteries and do not require an outlet. You can lock the door and place a small sign on it that asks for some privacy. You can pump your breast milk during lunch or other breaks. You could suggest to your employer that you are willing to make up work time for time spent pumping milk.   After pumping, you can refrigerate your milk, place it in a cooler, or freeze it for the baby to be fed later. Many breast pumps come with carrying cases that have a section to store your milk with ice packs. If you do not have access to a refrigerator,   you can leave it at room temperatures for up to 6 hours.   Many employers are not aware of state laws that state they have to allow you to breastfeed at your job. Under these laws, your employer is required to set up a space for you to breastfeed and/or allow paid/unpaid time for breastfeeding employees. To see if your state has a breastfeeding law for employers, go to http://www.llli.org/Law/LawBills.html or call us at 1-800-LALECHE (in [place]).  JAUNDICE  Jaundice is a condition that is common in many newborns. It appears as a yellowing of the skin and eyes. It is caused by an excess of bilirubin, a yellow pigment that is a product in the blood. All babies are born with extra red blood cells that undergo a process of being broken down and eliminated from the body. Bilirubin levels in the blood can be high because of:  Higher production of it in a newborn.   Increased ability of the newborn intestine to absorb it.   Limited ability of the newborn liver to handle large  amounts of it.  Many cases of jaundice do not need to be treated. Your baby's doctor will carefully monitor your baby's bilirubin levels. Sometimes infants have to be temporarily separated from their mothers to receive special treatment with phototherapy (aiming lights on the baby). In these cases, breastfeeding is encouraged but supplements may also be given to the baby. American Academy of Pediatrics advises against stopping breastfeeding in jaundiced babies and suggests continuing frequent breastfeeding, even during treatment. If your baby is jaundiced or develops jaundice, it is important to discuss with your baby's caregiver all possible treatment options. Share that you do not want to interrupt nursing if this is at all possible.  REFLUX  It is not unusual for babies to spit up after nursing. Usually, babies can spit up and show no other signs of illness. The spitting up disappears as the baby's digestive system matures. As long as the baby has 6 to 8 wet diapers and at least 2 bowel movements in a 24 hour period (under 6 weeks of age), and your baby is gaining weight (at least 4 ounces a week) you can be assured your baby is getting enough milk.  However, some babies have a condition called gastroesophageal reflux (GER). This happens when the muscle at the opening of the stomach opens at the wrong times, allowing milk and food to come back up into the the tube in the throat (esophagus). Symptoms of GER can include:   Crying as if in discomfort.   Waking up frequently at night.   Problems swallowing.   Frequent red or sore throat.   Signs of asthma, bronchitis, wheezing or problems breathing.   Projectile vomiting.   Arching of the back as if in severe pain.   Slow weight gain.   Gagging or choking.   Frequent hiccupping or burping.   Severe spitting up, or spitting up after every feeding, or hours after eating.   Refusal to eat.  Many healthy babies might have some of these  symptoms and do not have GER. But there are babies who might only have a few of these symptoms and have a severe case of GER. Not all babies with GER spit up or vomit.  Some babies with GER do not have a serious medical problem. But caring for them can be hard since they tend to be very fussy and wake up frequently at night. More severe cases of GER may need   to be treated with medication if the baby, in addition to spitting up, also refuses to nurse, gains weight poorly or is losing weight, or has periods of gagging or choking.   If your baby spits up after every feeding and has any of the other symptoms mentioned above, it is best to see his or her doctor for a correct diagnosis. Other than GER, your baby could have another condition that needs treatment. If there are no other signs of illness, he/she could just be sensitive to a food in your diet or a medication he/she is receiving. If your baby has GER, it is important to try to continue to breastfeed since breast milk still is more easily digested than formula. Try smaller, more frequent feedings, thorough burping, and putting the baby in an upright position during and after feedings.  CLEFT PALATE AND CLEFT LIP  Cleft palate and cleft lip are some of the most common birth defects that happen as a baby is developing in the womb. A cleft, or opening, in either the palate or lip can happen together or separately and both can be corrected through surgery. Both conditions can prevent babies from breastfeeding because a baby cannot form a good seal around the nipple and areola with his or her mouth, or get milk out of the breast well.   Cleft palate can happen on one or both sides of a baby's mouth and be partial or complete. Right after birth, a mother whose baby has a cleft palate can try to breastfeed her baby, and she can start expressing her milk right away to keep up her supply. Even if her baby cannot latch on well to her breast, the baby can be fed  breast milk by cup. In some hospitals, babies with cleft palate are fitted with a mouthpiece called an obturator. This fits into the cleft and seals it for easier feeding. The baby should be able to exclusively breastfeed after surgery.   Cleft lip can happen on one or both sides of a baby's lip. But a mother can try different breastfeeding positions and use her thumb or breast to help fill in the opening left by the lip to form a seal around the breast. With cleft lip repair, breastfeeding may only have to be stopped for a few hours.   If your baby is born with a cleft palate or cleft lip, talk with a lactation consultant in the hospital for assistance as soon as possible. Human milk and early breastfeeding is still best for your baby's health.  TWINS OR MULTIPLES Mothers of twins or multiples might feel overwhelmed with the idea of breastfeeding more than one baby at a time. The benefits of human milk to both these mothers and babies are the same as for all mothers and babies. When breastfeeding twins, your breast milk will increase to the amount the babies will need. You will have to take in more calories and liquids when nursing twins. If the babies are premature and unable to nurse, you can pump your breasts and freeze the milk until the babies are ready to nurse. But mothers of multiples get even more benefits from breastfeeding:  Their uterus contracts, which is helpful because it has stretched even more to hold more than one baby.   Hormones are released that relax the mother, which is helpful with the added stress of caring for more than one baby.   Eight to ten hours per week are saved because there is no   need to prepare formula or bottles and the mother's milk is available right away.   Breastfeeding early and often for a mother of multiples is important to keep up her milk supply. A good latch-on for each baby is important to avoid sore nipples. Many mothers find that it is easier to nurse  the babies together rather than separately, and that it gets easier as the babies get older. There are many breastfeeding holds that make it easier to nurse more than one baby at a time. If you are having multiples, talk with a lactation consultant about more ways you can successfully breastfeed your babies.  BREASTFEEDING DURING PREGNANCY While most mothers who are nursing a toddler stop breastfeeding if they find out they are pregnant, it is an individual choice to decide whether to keep breastfeeding during the pregnancy. It is not unsafe for the unborn child if you continue to breastfeed an older child during this time. But, if you are having some problems in your pregnancy such as uterine pain or bleeding, a history of preterm labor or problems gaining weight during pregnancy, your doctor may advise you to wean. Your child also may decide to wean on his or her own because pregnancy changes the amount and flavor of your milk. Some women also choose to wean at this time because they have nipple soreness caused by pregnancy hormones, are nauseous, tire more easily, or find that their growing stomachs make breastfeeding uncomfortable. You will need more calories when you breastfeed while pregnant. Your milk production usually slows down around the fourth month of pregnancy.  BREASTFEEDING AFTER BREAST SURGERY   If you have had breast surgery, including breast implants, you might be worried about whether you will be able to breastfeed. The most important things that affect whether you can produce enough milk for your baby are how your surgery was done and where your incisions are, and the reasons for your surgery. For example, women who have had incisions in the fold under the breasts are less likely to have problems producing milk than women who have had incisions around or across the areola. Incisions around the areola can cut into milk ducts and nerves, where milk is produced and travels. Women who have had  breast surgery to augment breasts that never fully developed may not have enough glands to produce a full milk supply.   If you had breast surgery and are worried about how it will affect breastfeeding, talk with a lactation consultant. If you are planning breast surgery and are worried about how it will affect breastfeeding, talk with your surgeon about ways he or she can preserve as much of the breast tissue and milk ducts as possible.  INDUCING LACTATION  Many mothers who adopt want to breastfeed their babies and can do it successfully with some help. It is successful ? to  of the time it is tried. Many will need to supplement their breast milk with donated breast milk or infant formula. But some adoptive mothers can breastfeed exclusively, especially if they have been pregnant before. Lactation is a hormonal response to a physical action. So the stimulation of the baby nursing causes the body to see a need for and produce milk. The more the baby nurses, the more a woman's body will produce milk.   Beginning a combination of hormone treatment several months before the baby is born should help facilitate lactation in many cases.   One thing you can do to prepare is to pump   every 3 hours around the clock for two to three weeks before your baby arrives. Or you can wait until the baby arrives and starts to nurse. Breast milk can be frozen until you are ready to nurse the baby. A supplemental nursing system (SNS) or a lactation aid can help ensure that your baby gets enough nutrition and that your breasts are stimulated to produce milk at the same time.   If you are an adoptive mother who wants to breastfeed, you should see both a lactation consultant and your doctor for help in establishing an initial milk supply.  FOR MORE INFORMATION [employer]: www.llli.org Document Released: 11/12/2005 Document Revised: 05/10/2011 Document Reviewed: 02/04/2008 [employer] Patient Information 2012  [employer] 

## 2011-10-04 ENCOUNTER — Other Ambulatory Visit: Payer: Federal, State, Local not specified - PPO

## 2011-10-04 ENCOUNTER — Encounter: Payer: Federal, State, Local not specified - PPO | Admitting: Obstetrics and Gynecology

## 2011-10-21 ENCOUNTER — Encounter (HOSPITAL_COMMUNITY)
Admission: RE | Admit: 2011-10-21 | Discharge: 2011-10-21 | Disposition: A | Discharge status: Home / Self Care | Payer: Federal, State, Local not specified - PPO | Source: Ambulatory Visit | Attending: Obstetrics and Gynecology | Admitting: Obstetrics and Gynecology

## 2011-10-21 DIAGNOSIS — O923 Agalactia: Secondary | ICD-10-CM | POA: Insufficient documentation

## 2011-10-24 ENCOUNTER — Encounter: Payer: Self-pay | Admitting: Obstetrics and Gynecology

## 2011-10-24 ENCOUNTER — Ambulatory Visit (INDEPENDENT_AMBULATORY_CARE_PROVIDER_SITE_OTHER): Payer: Federal, State, Local not specified - PPO | Admitting: Obstetrics and Gynecology

## 2011-10-24 DIAGNOSIS — Z124 Encounter for screening for malignant neoplasm of cervix: Secondary | ICD-10-CM

## 2011-10-24 MED ORDER — NORETHINDRONE 0.35 MG PO TABS: 1.0000 | ORAL_TABLET | Freq: Every day | ORAL | Status: DC

## 2011-10-24 NOTE — Progress Notes (Signed)
Norma Schmidt  is 5 weeks postpartum following a spontaneous vaginal delivery at 37 gestational weeks Date: 09/20/2011 female  baby named  Olene Craven delivered by  Nigel Bridgeman CNM Breastfeeding: yes Bottlefeeding:  yes  Post-partum blues / depression:  no  EPDS score:2  History of abnormal Pap:   Yes   Last Pap: Date  08/11/2010 Gestational diabetes:  no  Contraception:  Desires no method  Normal urinary function:  yes Normal GI function:  yes Returning to work:  Yes  Subjective:     Norma Schmidt is a 31 y.o. female who presents for a postpartum visit.  I have fully reviewed the prenatal and intrapartum course.    Patient is not sexually active.   The following portions of the patient's history were reviewed and updated as appropriate: allergies, current medications, past family history, past medical history, past social history, past surgical history and problem list.  Review of Systems Pertinent items are noted in HPI.   Objective:    BP 118/70  Wt 205 lb (92.987 kg)  Breastfeeding? Yes  General:  alert, cooperative and no distress     Lungs: clear to auscultation bilaterally  Heart:  regular rate and rhythm, S1, S2 normal, no murmur  Abdomen: soft, non-tender; bowel sounds normal; no masses,  no organomegaly   Vulva:  normal  Vagina: normal vagina  Cervix:  normal  Corpus: normal size, contour, position, consistency, mobility, non-tender  Adnexa:  normal adnexa             Assessment:     Normal postpartum exam.  Pap smear done at today's visit.   Plan:     1. Contraception: oral progesterone-only contraceptive 3. Follow up for AEX    Norma Schmidt A MD 10/24/2011 3:04 PM

## 2011-10-25 ENCOUNTER — Ambulatory Visit: Payer: Federal, State, Local not specified - PPO | Admitting: Obstetrics and Gynecology

## 2011-10-31 LAB — PAP IG W/ RFLX HPV ASCU

## 2011-11-21 ENCOUNTER — Encounter (HOSPITAL_COMMUNITY)
Admission: RE | Admit: 2011-11-21 | Discharge: 2011-11-21 | Disposition: A | Discharge status: Home / Self Care | Payer: Federal, State, Local not specified - PPO | Source: Ambulatory Visit | Attending: Obstetrics and Gynecology | Admitting: Obstetrics and Gynecology

## 2011-11-21 DIAGNOSIS — O923 Agalactia: Secondary | ICD-10-CM | POA: Insufficient documentation

## 2013-04-26 IMAGING — US US OB DETAIL+14 WK
1 series · 14 of 28 positions shown · non-contrast
Comparison: none

[Series 1: us ob detail+14 wk · 0.18mm/px · 102 acquisitions, 14 frames shown]
[im 4/102]
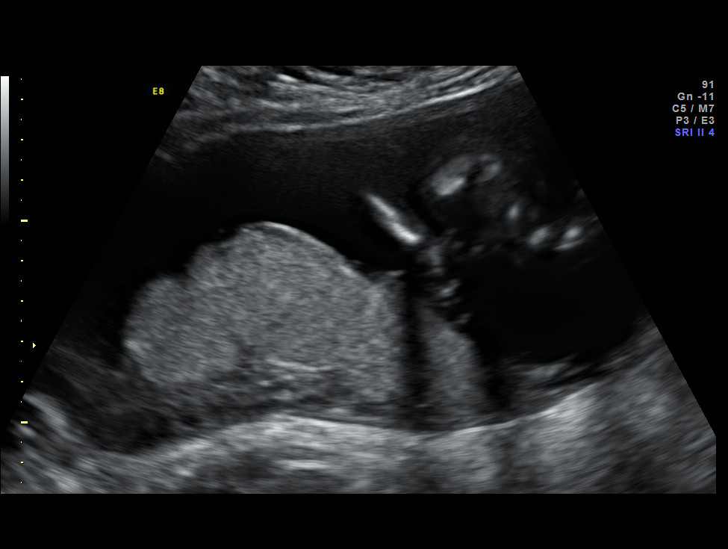
[im 12/102]
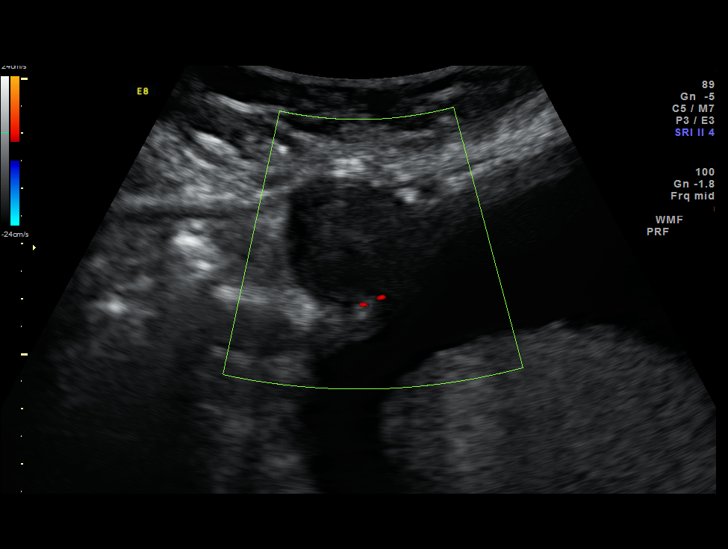
[im 19/102]
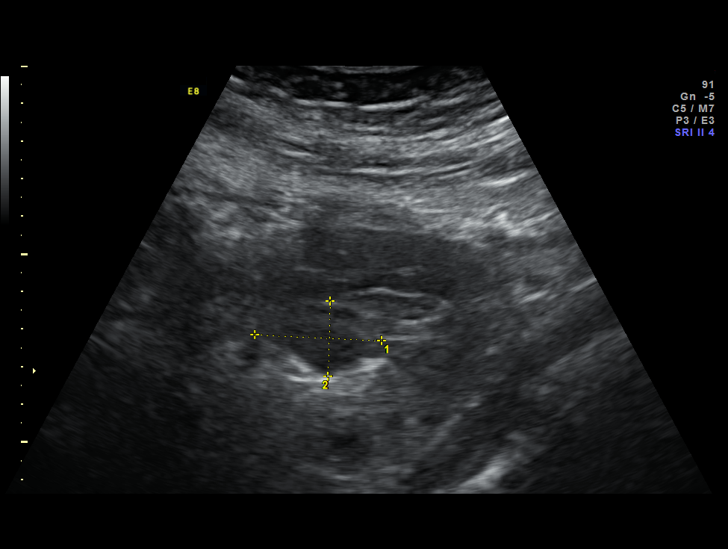
[im 27/102]
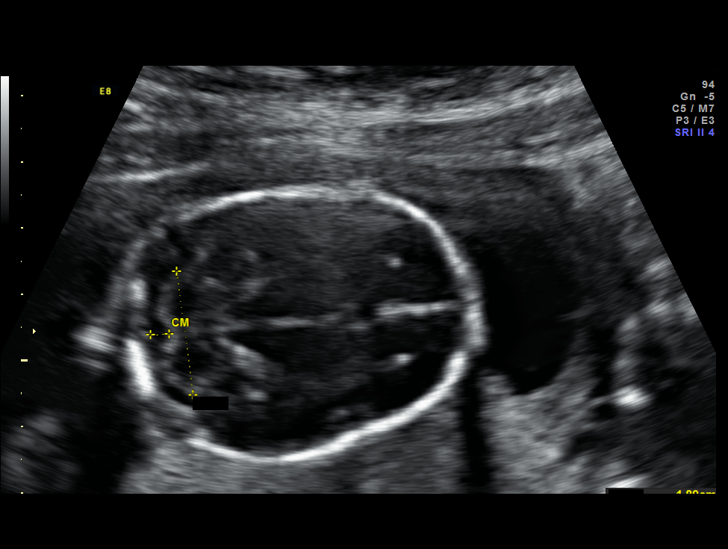
[im 34/102]
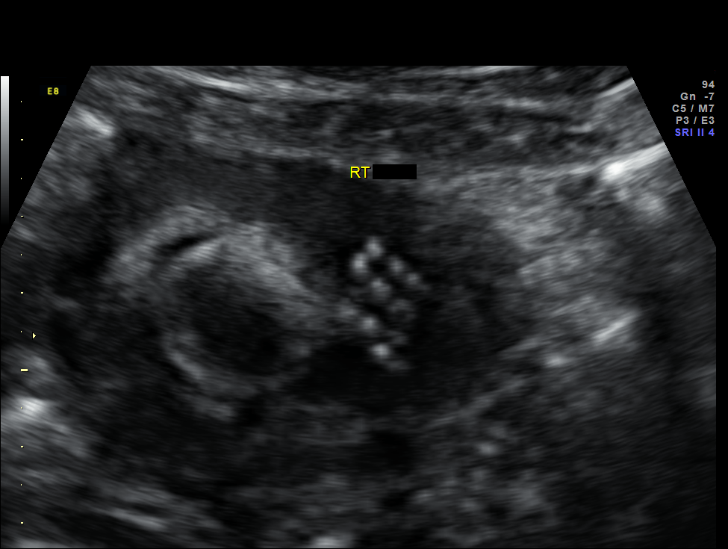
[im 42/102]
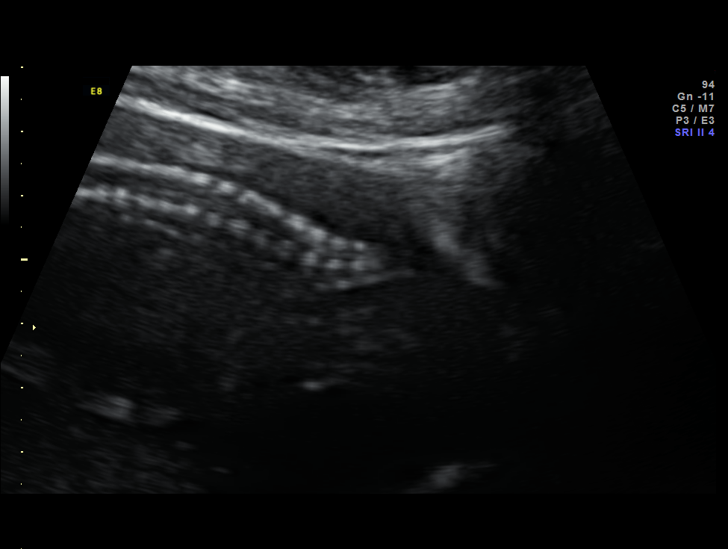
[im 49/102]
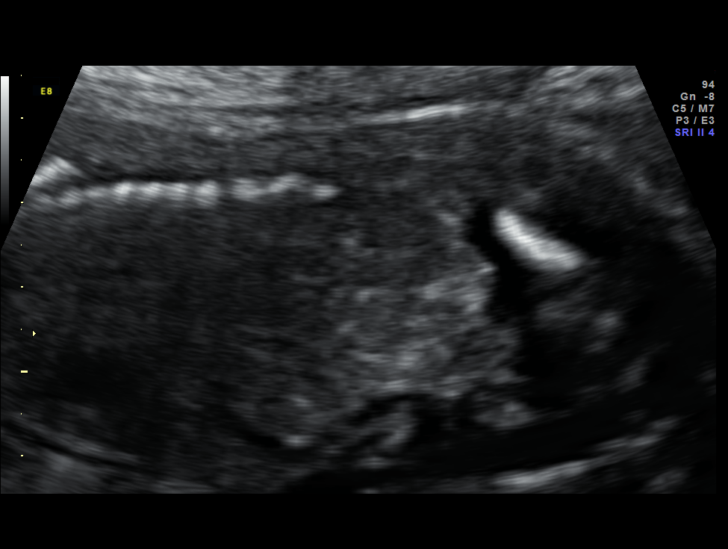
[im 57/102]
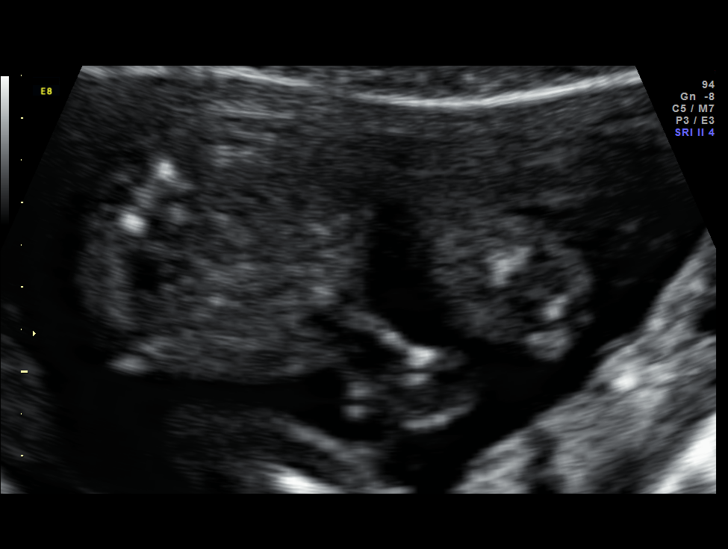
[im 64/102]
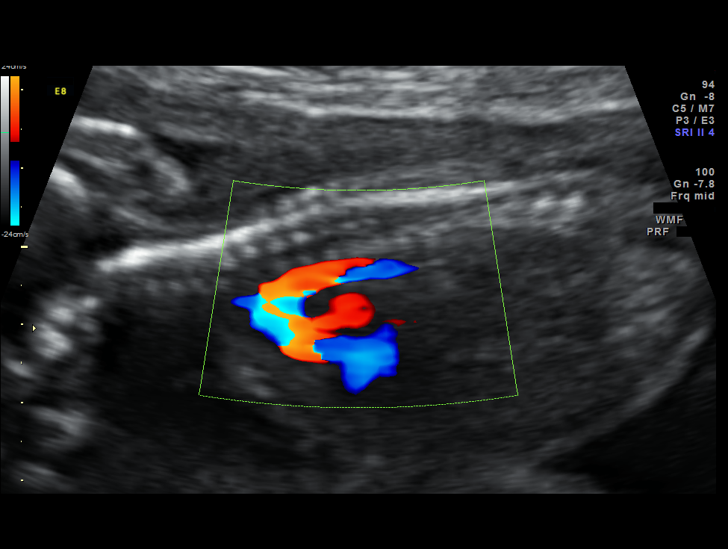
[im 72/102]
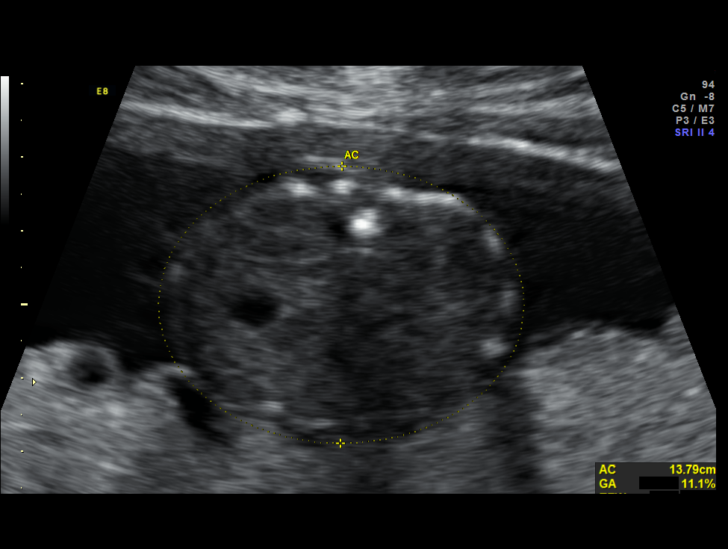
[im 79/102]
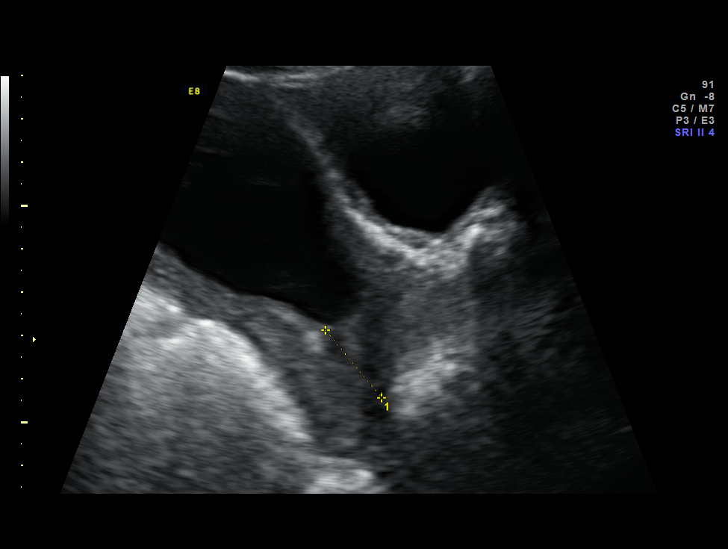
[im 87/102]
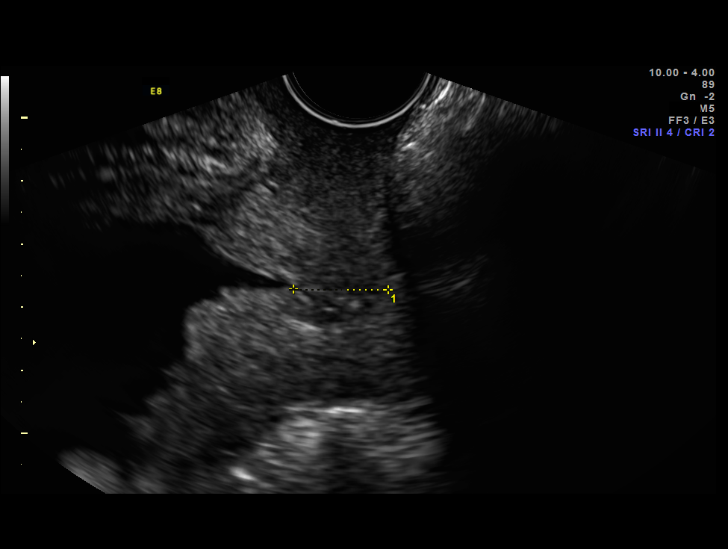
[im 94/102]
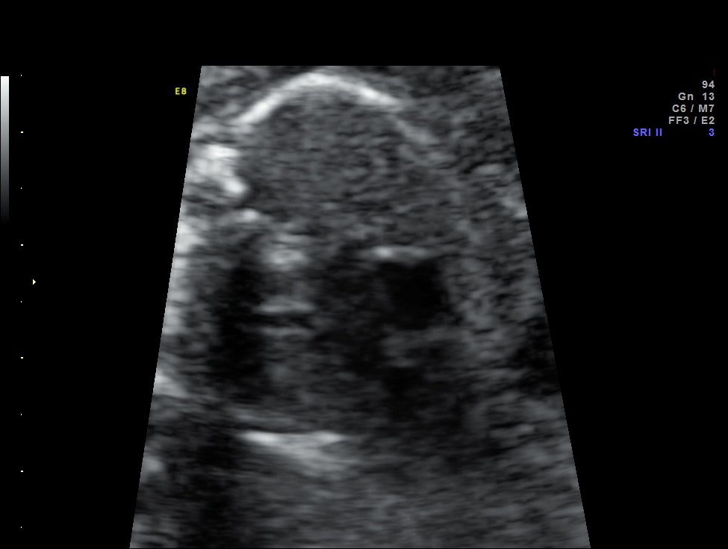
[im 102/102]
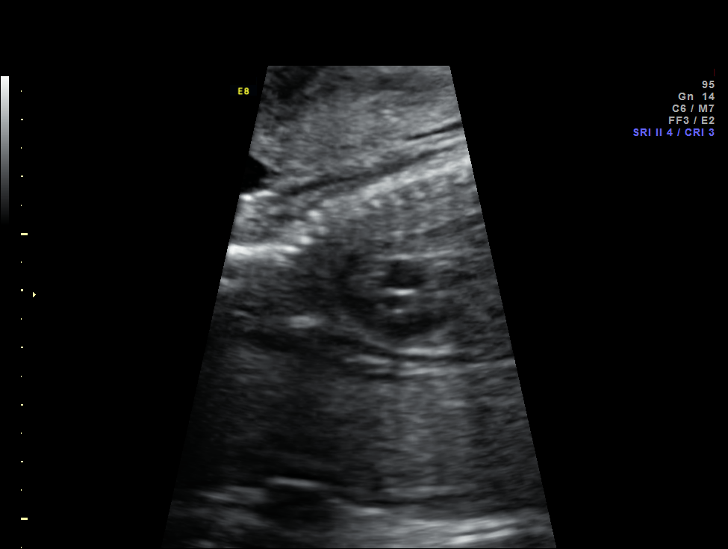

[14 of 28 positions shown; findings below may reference images not displayed]

Canned report from images found in remote index.

Refer to host system for actual result text.

## 2013-08-13 IMAGING — US US OB LIMITED
1 series · 13 of 14 positions shown · non-contrast
Comparison: none

[Series 1: us ob limited · 0.26mm/px · 13 of 14 slices shown]
[im 1/14]
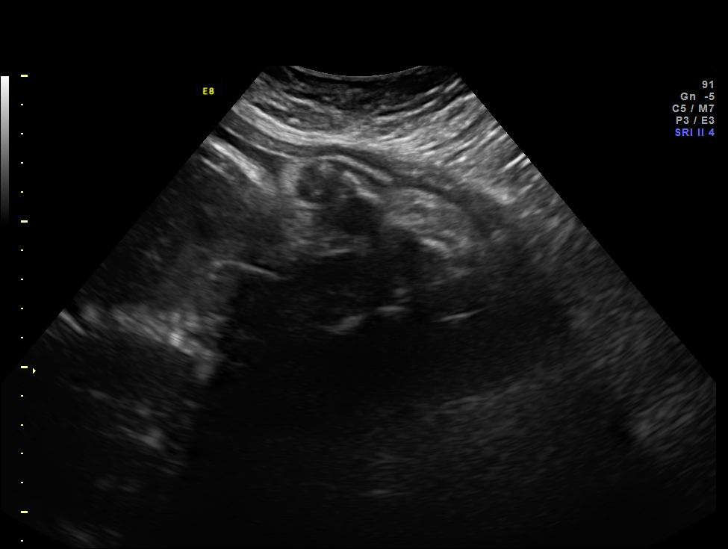
[im 2/14]
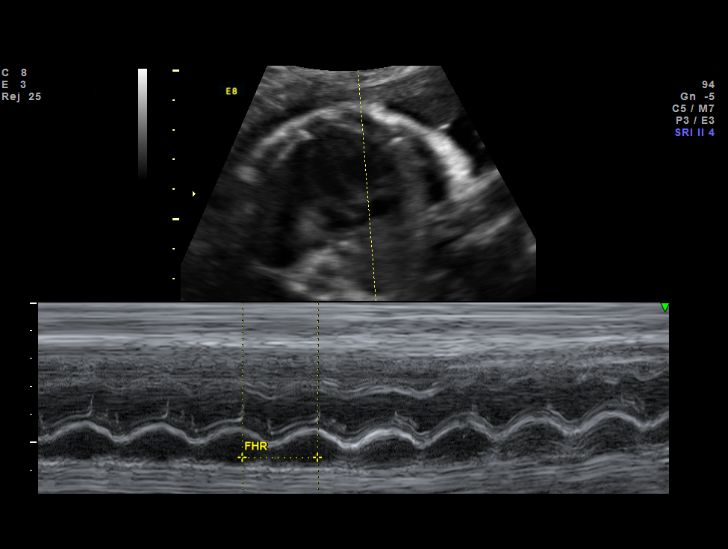
[im 3/14]
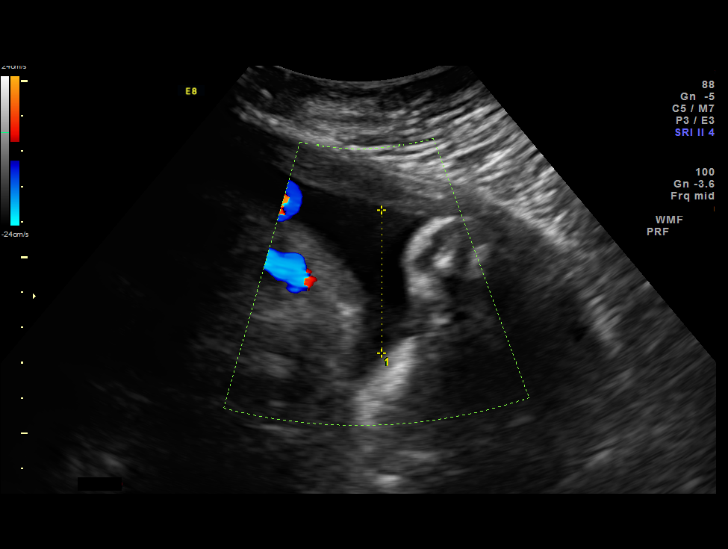
[im 4/14]
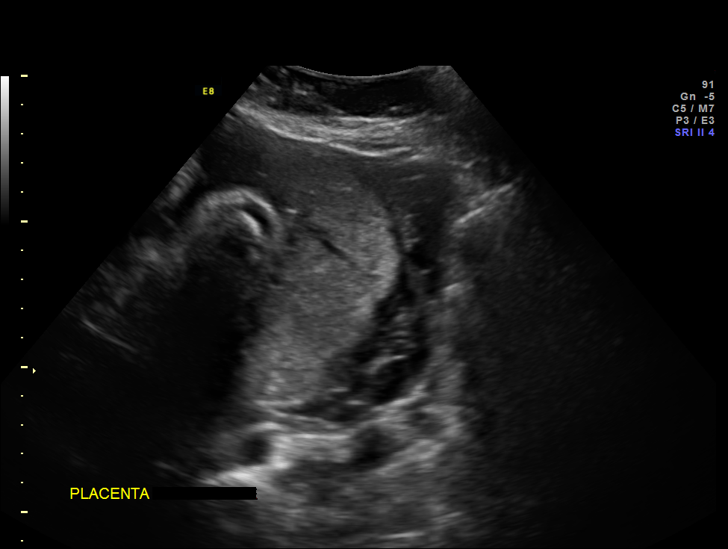
[im 5/14]
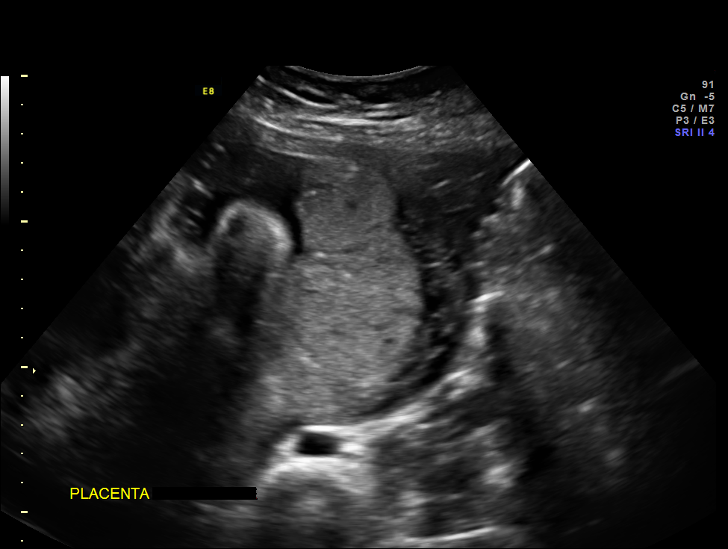
[im 6/14]
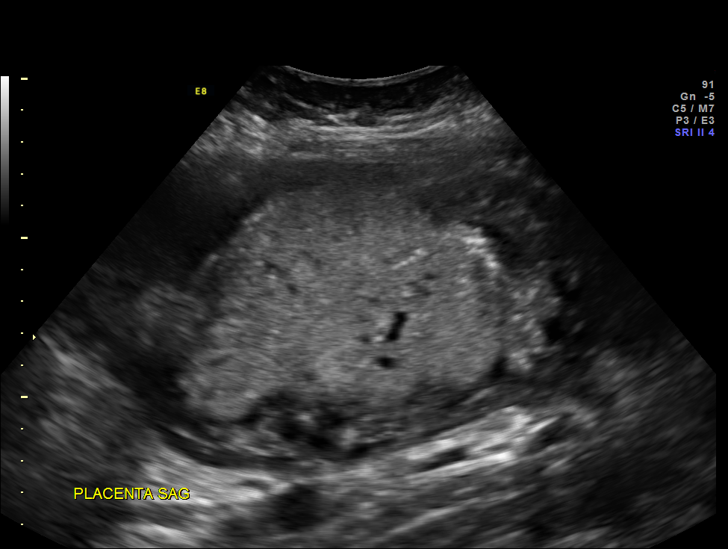
[im 8/14]
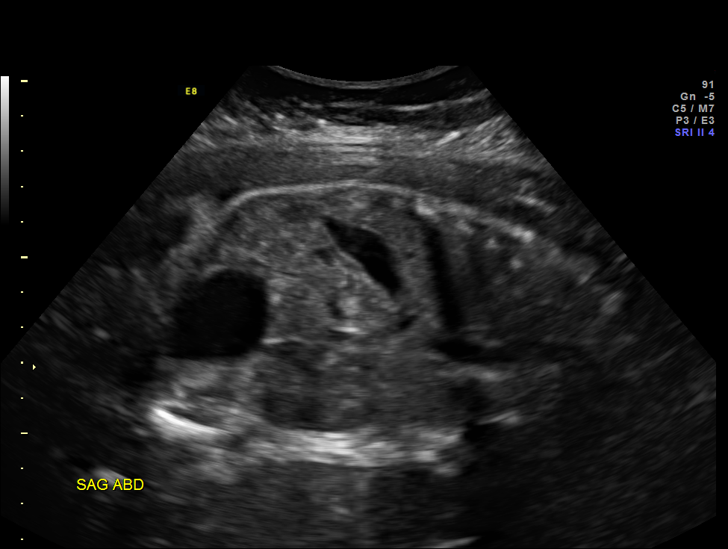
[im 9/14]
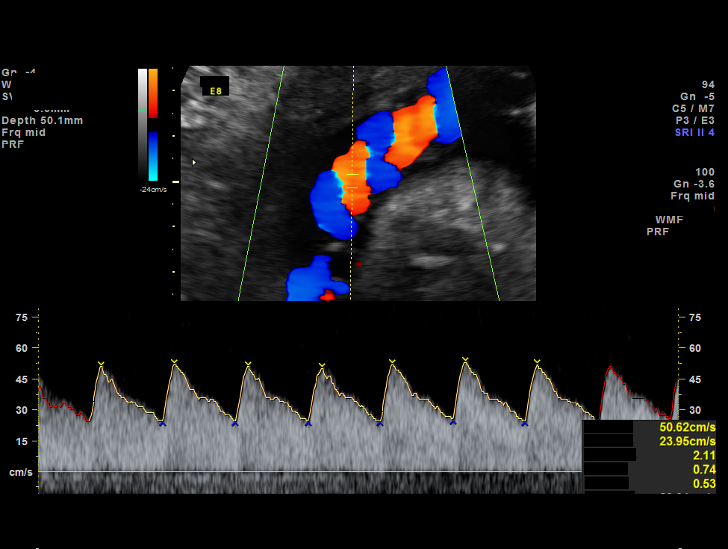
[im 10/14]
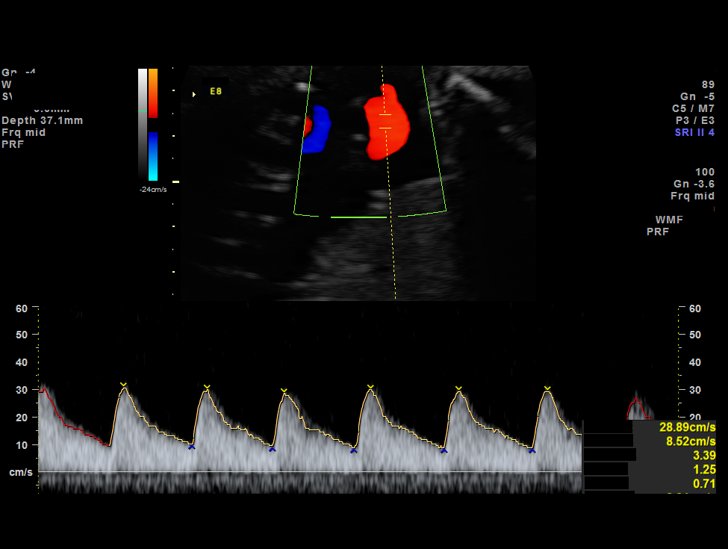
[im 11/14]
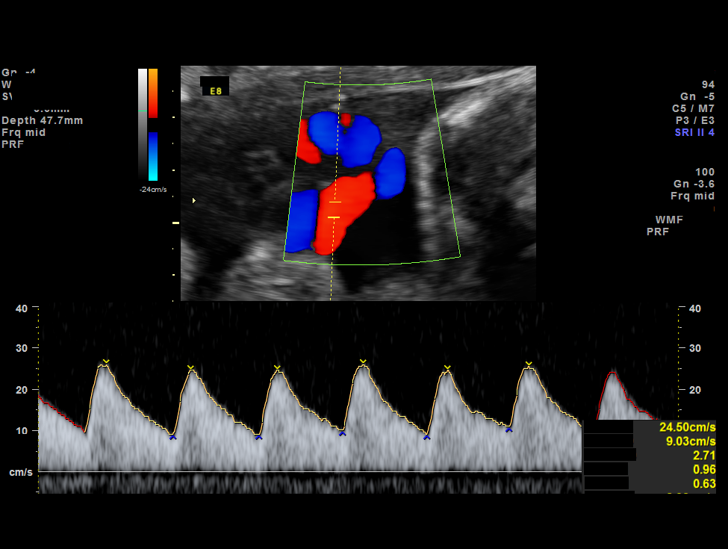
[im 12/14]
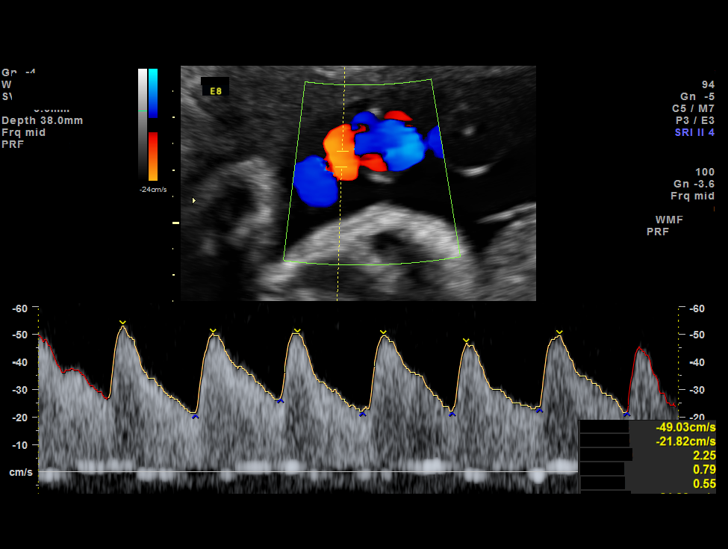
[im 13/14]
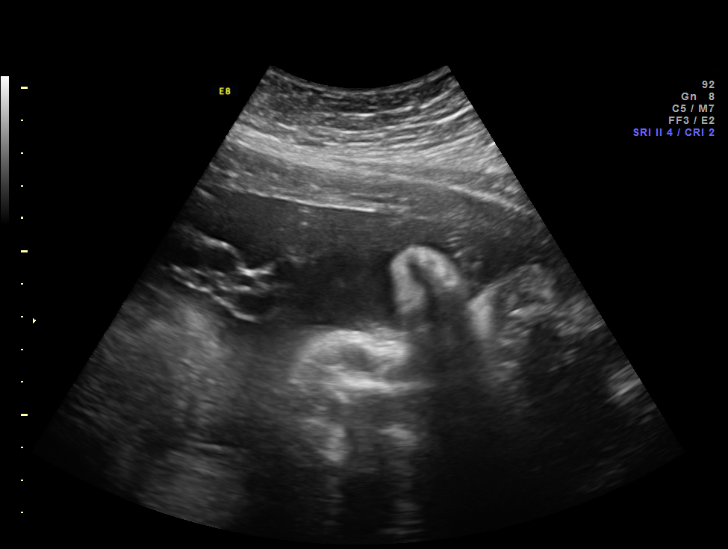
[im 14/14]
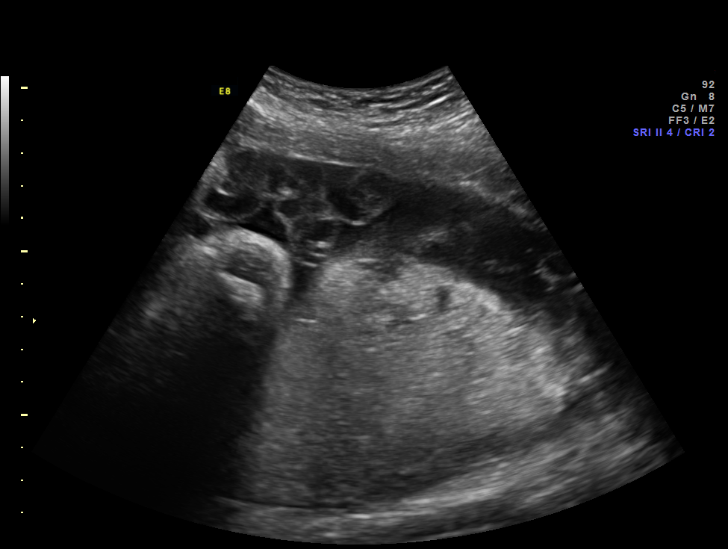

[13 of 14 positions shown; findings below may reference images not displayed]

OBSTETRICS REPORT
                      (Signed Final 09/04/2011 [DATE])

 Order#:         [id number]
                 87_O
Procedures

 [HOSPITAL]                                         76815.0
 US UA DOPPLER RE-EVAL                                 76828.1
Indications

 Cervical shortening and funnelling
 Cervical insufficiency (status post rescue cerclage)
 Previous midtrimester loss (twins at 14 weeks)
 Uterine fibroids
 Lagging fetal growth
 Prior positive FAZALKHALIQ and borderline ACL IgM
 antibody
 Assess fetal well being
Fetal Evaluation

 Fetal Heart Rate:  134                          bpm
 Cardiac Activity:  Observed
 Presentation:      Cephalic
 Placenta:          Posterior, above cervical
                    os
 P. Cord            Previously Visualized
 Insertion:

 Amniotic Fluid
 AFI FV:      Subjectively low normal
                                             Larg Pckt:     4.0  cm
Gestational Age

 LMP:           36w 0d        Date:  12/26/10                 EDD:   10/02/11
 Best:          35w 1d     Det. By:  Early Ultrasound         EDD:   10/08/11
Doppler - Fetal Vessels

 Umbilical Artery
 S/D:   2.62           58  %tile       RI:
 PI:    0.94                           PSV:       50.62   cm/s
 Umbilical Artery
 Absent DFV:    No     Reverse DFV:    No
Impression

 IUP at 35+1 weeks
 Low normal amniotic fluid volume
 UA dopplers were normal for this GA
 NST reactive
Recommendations

 Continue twice weekly testing

## 2014-04-05 ENCOUNTER — Encounter: Payer: Self-pay | Admitting: Obstetrics and Gynecology

## 2021-02-14 ENCOUNTER — Encounter: Payer: Self-pay | Admitting: Neurology

## 2021-05-15 ENCOUNTER — Ambulatory Visit: Payer: Self-pay | Admitting: Neurology

## 2021-05-17 NOTE — Progress Notes (Addendum)
NEUROLOGY CONSULTATION NOTE  Norma Schmidt MRN: [id number] DOB: 1980/12/28  Referring provider: Daylene Katayama, PA-C Primary care provider: Daylene Katayama, PA-C  Reason for consult:  migraines  Assessment/Plan:   Migraine without aura, without status migrainosus, not intractable Syncope - occurred during exercising.  She does have a murmur but she has been evaluated by cardiology and cardiac workup negative.  Unclear if it could have been secondary to propranolol  Migraine prevention:  Stop propranolol.  Start topiramate - 25mg  at bedtime for one week, then 50mg  at bedtime Migraine rescue:  Stop sumatriptan.  Start rizatriptan 10mg .  Take Zofran for nausea. Limit use of pain relievers to no more than 2 days out of week to prevent risk of rebound or medication-overuse headache. Keep headache diary Follow up 7 to 8 months    Subjective:  Norma Schmidt is a 40 year old right-handed female who presents for migraines.  History supplemented by referring provider's note.  Onset:  40 years old Location:  above the left eye Quality:  pressure Intensity:  8/10 May wake up with headache Aura:  absent Prodrome:  absent Associated symptoms:  Nausea, dizzy, photophobia, phonophobia, slight blurred vision.  She denies associated vomiting, osmophobia, autonomic symptoms, unilateral numbness or weakness. Duration:  2-3 days (1 day with sumatriptan) Frequency:  1 to 2 a week Triggers:  unknown Relieving factors:  rest Activity:  When she has a migraine, unable to work at a computer or talk on phone.   She was initially trying to conceive but she now says she is no longer trying to have a baby. Also with history of syncope.  Happened while exercising.  She said that she saw cardiology and that cardiac workup was negative.  No recurrent syncope but sometimes feels dizzy.  Current NSAIDS/analgesics: none Current triptans:  sumatriptan 50mg  Current ergotamine:  none Current  anti-emetic:  none Current muscle relaxants:  none Current Antihypertensive medications:  propranolol  Current Antidepressant medications:  none Current Anticonvulsant medications:  none Current anti-CGRP:  none Current Vitamins/Herbal/Supplements:  MVI Current Antihistamines/Decongestants:  none Other therapy:  none Hormone/birth control:  JUNEL FE   Past NSAIDS/analgesics:  acetaminophen, ibuprofen, naproxen, Excedrin, meloxicam Past abortive triptans:  none Past abortive ergotamine:  none Past muscle relaxants:  none Past anti-emetic:  Zofran 4mg  Past antihypertensive medications:  none Past antidepressant medications:  none Past anticonvulsant medications:  none Past anti-CGRP:  none Past vitamins/Herbal/Supplements:  none Past antihistamines/decongestants:  none Other past therapies:  none  Caffeine:  No coffee or soda Diet:  Hydrates.  Ginger ale once in awhile.   Exercise:  yes Depression:  no; Anxiety:  no Other pain:  no Sleep hygiene:  good Family history of headache:  No family history.  No family history of aneurysms.      PAST MEDICAL HISTORY: Past Medical History:  Diagnosis Date   Abnormal Pap smear    Herpes simplex type 2 infection    History of chicken pox    History of uterine fibroid    Positive ANA (antinuclear antibody)     PAST SURGICAL HISTORY: Past Surgical History:  Procedure Laterality Date   CERVICAL CERCLAGE  05/22/2011   Procedure: CERCLAGE CERVICAL;  Surgeon: 26, MD;  Location: [facility];  Service: Gynecology;  Laterality: N/A;   COLPOSCOPY     DILATION AND CURETTAGE OF UTERUS     WISDOM TOOTH EXTRACTION      MEDICATIONS: Current Outpatient Medications on File Prior to Visit  Medication Sig  Dispense Refill   docusate sodium (COLACE) 100 MG capsule Take 100 mg by mouth daily.     norethindrone (ORTHO MICRONOR) 0.35 MG tablet Take 1 tablet (0.35 mg total) by mouth daily. 1 Package 11   Prenatal Vit-Fe Fumarate-FA  (PRENATAL MULTIVITAMIN) TABS Take 1 tablet by mouth daily.       No current facility-administered medications on file prior to visit.    ALLERGIES: No Known Allergies  FAMILY HISTORY: Family History  Problem Relation Age of Onset   Alcohol abuse Father    Cancer Father        lung   Diabetes Maternal Aunt    Hypertension Maternal Aunt    Arthritis Maternal Grandmother    Alzheimer's disease Maternal Grandmother    Heart disease Mother        pace maker   Cancer Paternal Grandmother        cervical   Birth defects Brother        premature del at 28weeks    Objective:  Blood pressure 127/87, pulse 91, height 5\' 10"  (1.778 m), weight 241 lb 12.8 oz (109.7 kg), SpO2 99 %, currently breastfeeding. General: No acute distress.  Patient appears well-groomed.   Head:  Normocephalic/atraumatic Eyes:  fundi examined but not visualized Neck: supple, no paraspinal tenderness, full range of motion Back: No paraspinal tenderness Heart: murmr, regular rate and rhythm Lungs: Clear to auscultation bilaterally. Vascular: No carotid bruits. Neurological Exam: Mental status: alert and oriented to person, place, and time, recent and remote memory intact, fund of knowledge intact, attention and concentration intact, speech fluent and not dysarthric, language intact. Cranial nerves: CN I: not tested CN II: pupils equal, round and reactive to light, visual fields intact CN III, IV, VI:  full range of motion, no nystagmus, no ptosis CN V: facial sensation intact. CN VII: upper and lower face symmetric CN VIII: hearing intact CN IX, X: gag intact, uvula midline CN XI: sternocleidomastoid and trapezius muscles intact CN XII: tongue midline Bulk & Tone: normal, no fasciculations. Motor:  muscle strength 5/5 throughout Sensation:  Pinprick, temperature and vibratory sensation intact. Deep Tendon Reflexes:  2+ throughout,  toes downgoing.   Finger to nose testing:  Without dysmetria.   Heel to  shin:  Without dysmetria.   Gait:  Normal station and stride.  Romberg negative.    Thank you for allowing me to take part in the care of this patient.  , DO  CC: Shon Millet, PA-C

## 2021-05-18 ENCOUNTER — Other Ambulatory Visit: Payer: Self-pay

## 2021-05-18 ENCOUNTER — Encounter: Payer: Self-pay | Admitting: Neurology

## 2021-05-18 ENCOUNTER — Ambulatory Visit: Payer: No Typology Code available for payment source | Admitting: Neurology

## 2021-05-18 VITALS — BP 127/87 | HR 91 | Ht 70.0 in | Wt 241.8 lb

## 2021-05-18 DIAGNOSIS — R55 Syncope and collapse: Secondary | ICD-10-CM | POA: Diagnosis not present

## 2021-05-18 DIAGNOSIS — G43009 Migraine without aura, not intractable, without status migrainosus: Secondary | ICD-10-CM

## 2021-05-18 MED ORDER — RIZATRIPTAN BENZOATE 10 MG PO TBDP: ORAL_TABLET | ORAL | 5 refills | Status: AC

## 2021-05-18 MED ORDER — ONDANSETRON 4 MG PO TBDP: 4.0000 mg | ORAL_TABLET | Freq: Three times a day (TID) | ORAL | 5 refills | Status: DC | PRN

## 2021-05-18 MED ORDER — TOPIRAMATE 50 MG PO TABS: 50.0000 mg | ORAL_TABLET | Freq: Every day | ORAL | 5 refills | Status: AC

## 2021-05-18 NOTE — Patient Instructions (Addendum)
°  Stop propranolol.  Start TOPIRAMATE 50mg  - take 1/2 tablet at bedtime for one week.  Then increase to 1 tablet at bedtime.  Contact in 6 weeks with update and we can increase dose if needed. Stop sumatriptan.  Instead, Take RIZATRIPTAN 10 at earliest onset of headache.  May repeat dose once in 2 hours if needed.  Maximum 2 tablets in 24 hours.  TAKE ONDANSETRON FOR NAUSEA Limit use of pain relievers to no more than 2 days out of the week.  These medications include acetaminophen, NSAIDs (ibuprofen/Advil/Motrin, naproxen/Aleve, triptans (Imitrex/sumatriptan), Excedrin, and narcotics.  This will help reduce risk of rebound headaches. Be aware of common food triggers:  - Caffeine:  coffee, black tea, cola, Mt. Dew  - Chocolate  - Dairy:  aged cheeses (brie, blue, cheddar, gouda, [place], provolone, [place], Swiss, etc), chocolate milk, buttermilk, sour cream, limit eggs and yogurt  - Nuts, peanut butter  - Alcohol  - Cereals/grains:  FRESH breads (fresh bagels, sourdough, doughnuts), yeast productions  - Processed/canned/aged/cured meats (pre-packaged deli meats, hotdogs)  - MSG/glutamate:  soy sauce, flavor enhancer, pickled/preserved/marinated foods  - Sweeteners:  aspartame (Equal, Nutrasweet).  Sugar and Splenda are okay  - Vegetables:  legumes (lima beans, lentils, snow peas, fava beans, pinto peans, peas, garbanzo beans), sauerkraut, onions, olives, pickles  - Fruit:  avocados, bananas, citrus fruit (orange, lemon, grapefruit), mango  - Other:  Frozen meals, macaroni and cheese Routine exercise Stay adequately hydrated (aim for 64 oz water daily) Keep headache diary Maintain proper stress management Maintain proper sleep hygiene Do not skip meals Consider supplements:  magnesium citrate 400mg  daily, riboflavin 400mg  daily, coenzyme Q10 100mg  three times daily. Follow up 7 to 8 months.

## 2022-01-17 ENCOUNTER — Ambulatory Visit: Payer: No Typology Code available for payment source | Admitting: Neurology

## 2022-01-29 ENCOUNTER — Emergency Department (HOSPITAL_BASED_OUTPATIENT_CLINIC_OR_DEPARTMENT_OTHER): Payer: No Typology Code available for payment source

## 2022-01-29 ENCOUNTER — Encounter (HOSPITAL_COMMUNITY): Payer: Self-pay

## 2022-01-29 ENCOUNTER — Other Ambulatory Visit: Payer: Self-pay

## 2022-01-29 ENCOUNTER — Encounter (HOSPITAL_BASED_OUTPATIENT_CLINIC_OR_DEPARTMENT_OTHER): Payer: Self-pay | Admitting: Emergency Medicine

## 2022-01-29 ENCOUNTER — Inpatient Hospital Stay (HOSPITAL_BASED_OUTPATIENT_CLINIC_OR_DEPARTMENT_OTHER)
Admission: EM | Admit: 2022-01-29 | Discharge: 2022-02-03 | DRG: 175 | Disposition: A | Discharge status: Home / Self Care | Payer: No Typology Code available for payment source | Source: Ambulatory Visit | Attending: Student | Admitting: Student

## 2022-01-29 DIAGNOSIS — I2609 Other pulmonary embolism with acute cor pulmonale: Secondary | ICD-10-CM | POA: Diagnosis present

## 2022-01-29 DIAGNOSIS — Z8669 Personal history of other diseases of the nervous system and sense organs: Secondary | ICD-10-CM | POA: Diagnosis not present

## 2022-01-29 DIAGNOSIS — Z79899 Other long term (current) drug therapy: Secondary | ICD-10-CM | POA: Diagnosis not present

## 2022-01-29 DIAGNOSIS — Z793 Long term (current) use of hormonal contraceptives: Secondary | ICD-10-CM | POA: Diagnosis not present

## 2022-01-29 DIAGNOSIS — J9601 Acute respiratory failure with hypoxia: Secondary | ICD-10-CM

## 2022-01-29 DIAGNOSIS — Z801 Family history of malignant neoplasm of trachea, bronchus and lung: Secondary | ICD-10-CM | POA: Diagnosis not present

## 2022-01-29 DIAGNOSIS — R778 Other specified abnormalities of plasma proteins: Secondary | ICD-10-CM | POA: Diagnosis present

## 2022-01-29 DIAGNOSIS — D696 Thrombocytopenia, unspecified: Secondary | ICD-10-CM | POA: Diagnosis not present

## 2022-01-29 DIAGNOSIS — Z8261 Family history of arthritis: Secondary | ICD-10-CM

## 2022-01-29 DIAGNOSIS — I97618 Postprocedural hemorrhage and hematoma of a circulatory system organ or structure following other circulatory system procedure: Secondary | ICD-10-CM | POA: Diagnosis not present

## 2022-01-29 DIAGNOSIS — I2694 Multiple subsegmental pulmonary emboli without acute cor pulmonale: Secondary | ICD-10-CM | POA: Diagnosis not present

## 2022-01-29 DIAGNOSIS — I10 Essential (primary) hypertension: Secondary | ICD-10-CM | POA: Diagnosis present

## 2022-01-29 DIAGNOSIS — Z820 Family history of epilepsy and other diseases of the nervous system: Secondary | ICD-10-CM

## 2022-01-29 DIAGNOSIS — R7989 Other specified abnormal findings of blood chemistry: Secondary | ICD-10-CM | POA: Diagnosis present

## 2022-01-29 DIAGNOSIS — D62 Acute posthemorrhagic anemia: Secondary | ICD-10-CM | POA: Diagnosis not present

## 2022-01-29 DIAGNOSIS — R918 Other nonspecific abnormal finding of lung field: Secondary | ICD-10-CM | POA: Diagnosis present

## 2022-01-29 DIAGNOSIS — Z6833 Body mass index (BMI) 33.0-33.9, adult: Secondary | ICD-10-CM

## 2022-01-29 DIAGNOSIS — T384X5A Adverse effect of oral contraceptives, initial encounter: Secondary | ICD-10-CM | POA: Diagnosis present

## 2022-01-29 DIAGNOSIS — Y848 Other medical procedures as the cause of abnormal reaction of the patient, or of later complication, without mention of misadventure at the time of the procedure: Secondary | ICD-10-CM | POA: Diagnosis not present

## 2022-01-29 DIAGNOSIS — I2699 Other pulmonary embolism without acute cor pulmonale: Secondary | ICD-10-CM

## 2022-01-29 DIAGNOSIS — Z20822 Contact with and (suspected) exposure to covid-19: Secondary | ICD-10-CM | POA: Diagnosis present

## 2022-01-29 DIAGNOSIS — I959 Hypotension, unspecified: Secondary | ICD-10-CM | POA: Diagnosis not present

## 2022-01-29 DIAGNOSIS — Z833 Family history of diabetes mellitus: Secondary | ICD-10-CM | POA: Diagnosis not present

## 2022-01-29 DIAGNOSIS — R059 Cough, unspecified: Secondary | ICD-10-CM | POA: Diagnosis not present

## 2022-01-29 DIAGNOSIS — G43909 Migraine, unspecified, not intractable, without status migrainosus: Secondary | ICD-10-CM | POA: Diagnosis present

## 2022-01-29 DIAGNOSIS — E669 Obesity, unspecified: Secondary | ICD-10-CM

## 2022-01-29 DIAGNOSIS — Z8249 Family history of ischemic heart disease and other diseases of the circulatory system: Secondary | ICD-10-CM

## 2022-01-29 DIAGNOSIS — R55 Syncope and collapse: Secondary | ICD-10-CM | POA: Diagnosis present

## 2022-01-29 DIAGNOSIS — R000 Tachycardia, unspecified: Secondary | ICD-10-CM | POA: Diagnosis not present

## 2022-01-29 DIAGNOSIS — R001 Bradycardia, unspecified: Secondary | ICD-10-CM | POA: Diagnosis not present

## 2022-01-29 LAB — CBC WITH DIFFERENTIAL/PLATELET
Abs Immature Granulocytes: 0.03 10*3/uL (ref 0.00–0.07)
Basophils Absolute: 0 10*3/uL (ref 0.0–0.1)
Basophils Relative: 0 %
Eosinophils Absolute: 0.1 10*3/uL (ref 0.0–0.5)
Eosinophils Relative: 1 %
HCT: 44.7 % (ref 36.0–46.0)
Hemoglobin: 15 g/dL (ref 12.0–15.0)
Immature Granulocytes: 0 %
Lymphocytes Relative: 22 %
Lymphs Abs: 2.5 10*3/uL (ref 0.7–4.0)
MCH: 30.1 pg (ref 26.0–34.0)
MCHC: 33.6 g/dL (ref 30.0–36.0)
MCV: 89.8 fL (ref 80.0–100.0)
Monocytes Absolute: 0.7 10*3/uL (ref 0.1–1.0)
Monocytes Relative: 6 %
Neutro Abs: 7.8 10*3/uL — ABNORMAL HIGH (ref 1.7–7.7)
Neutrophils Relative %: 71 %
Platelets: 267 10*3/uL (ref 150–400)
RBC: 4.98 MIL/uL (ref 3.87–5.11)
RDW: 13.1 % (ref 11.5–15.5)
WBC: 11.2 10*3/uL — ABNORMAL HIGH (ref 4.0–10.5)
nRBC: 0 % (ref 0.0–0.2)

## 2022-01-29 LAB — I-STAT ARTERIAL BLOOD GAS, ED
Acid-base deficit: 6 mmol/L — ABNORMAL HIGH (ref 0.0–2.0)
Bicarbonate: 16.3 mmol/L — ABNORMAL LOW (ref 20.0–28.0)
Calcium, Ion: 1.14 mmol/L — ABNORMAL LOW (ref 1.15–1.40)
HCT: 39 % (ref 36.0–46.0)
Hemoglobin: 13.3 g/dL (ref 12.0–15.0)
O2 Saturation: 91 %
Potassium: 4 mmol/L (ref 3.5–5.1)
Sodium: 137 mmol/L (ref 135–145)
TCO2: 17 mmol/L — ABNORMAL LOW (ref 22–32)
pCO2 arterial: 23.9 mmHg — ABNORMAL LOW (ref 32–48)
pH, Arterial: 7.44 (ref 7.35–7.45)
pO2, Arterial: 56 mmHg — ABNORMAL LOW (ref 83–108)

## 2022-01-29 LAB — LACTIC ACID, PLASMA: Lactic Acid, Venous: 1.1 mmol/L (ref 0.5–1.9)

## 2022-01-29 LAB — TROPONIN I (HIGH SENSITIVITY)
Troponin I (High Sensitivity): 23 ng/L — ABNORMAL HIGH (ref <18)
Troponin I (High Sensitivity): 25 ng/L — ABNORMAL HIGH (ref <18)
Troponin I (High Sensitivity): 26 ng/L — ABNORMAL HIGH (ref <18)

## 2022-01-29 LAB — BRAIN NATRIURETIC PEPTIDE: B Natriuretic Peptide: 612.2 pg/mL — ABNORMAL HIGH (ref 0.0–100.0)

## 2022-01-29 LAB — BASIC METABOLIC PANEL
Anion gap: 9 (ref 5–15)
BUN: 8 mg/dL (ref 6–20)
CO2: 20 mmol/L — ABNORMAL LOW (ref 22–32)
Calcium: 8.6 mg/dL — ABNORMAL LOW (ref 8.9–10.3)
Chloride: 107 mmol/L (ref 98–111)
Creatinine, Ser: 1.08 mg/dL — ABNORMAL HIGH (ref 0.44–1.00)
GFR, Estimated: >60 mL/min (ref >60)
Glucose, Bld: 98 mg/dL (ref 70–99)
Potassium: 3.9 mmol/L (ref 3.5–5.1)
Sodium: 136 mmol/L (ref 135–145)

## 2022-01-29 LAB — PROTIME-INR
INR: 1.1 (ref 0.8–1.2)
Prothrombin Time: 14.6 seconds (ref 11.4–15.2)

## 2022-01-29 LAB — APTT: aPTT: 30 seconds (ref 24–36)

## 2022-01-29 LAB — HCG, QUANTITATIVE, PREGNANCY: hCG, Beta Chain, Quant, S: <1 m[IU]/mL (ref <5)

## 2022-01-29 LAB — HEPARIN LEVEL (UNFRACTIONATED): Heparin Unfractionated: 0.46 IU/mL (ref 0.30–0.70)

## 2022-01-29 LAB — MAGNESIUM: Magnesium: 2.1 mg/dL (ref 1.7–2.4)

## 2022-01-29 MED ORDER — IOHEXOL 350 MG/ML SOLN
100.0000 mL | Freq: Once | INTRAVENOUS | Status: AC | PRN
  Administered 2022-01-29: 100 mL via INTRAVENOUS

## 2022-01-29 MED ORDER — LACTATED RINGERS IV BOLUS (SEPSIS)
1000.0000 mL | Freq: Once | INTRAVENOUS | Status: AC
  Administered 2022-01-29: 1000 mL via INTRAVENOUS

## 2022-01-29 MED ORDER — ACETAMINOPHEN 325 MG PO TABS
650.0000 mg | ORAL_TABLET | Freq: Four times a day (QID) | ORAL | Status: DC | PRN
  Administered 2022-01-31 – 2022-02-03 (×5): 650 mg via ORAL
  Filled 2022-01-29 (×5): qty 2

## 2022-01-29 MED ORDER — SODIUM CHLORIDE 0.9 % IV SOLN
1.0000 g | Freq: Once | INTRAVENOUS | Status: AC
  Administered 2022-01-29: 1 g via INTRAVENOUS
  Filled 2022-01-29: qty 10

## 2022-01-29 MED ORDER — SODIUM CHLORIDE 0.9 % IV SOLN: INTRAVENOUS | Status: DC | PRN

## 2022-01-29 MED ORDER — ACETAMINOPHEN 650 MG RE SUPP: 650.0000 mg | Freq: Four times a day (QID) | RECTAL | Status: DC | PRN

## 2022-01-29 MED ORDER — HEPARIN BOLUS VIA INFUSION
5500.0000 [IU] | Freq: Once | INTRAVENOUS | Status: AC
  Administered 2022-01-29: 5500 [IU] via INTRAVENOUS

## 2022-01-29 MED ORDER — HEPARIN (PORCINE) 25000 UT/250ML-% IV SOLN
1600.0000 [IU]/h | INTRAVENOUS | Status: DC
  Administered 2022-01-29 – 2022-02-03 (×7): 1600 [IU]/h via INTRAVENOUS
  Filled 2022-01-29 (×7): qty 250

## 2022-01-29 MED ORDER — LACTATED RINGERS IV SOLN: INTRAVENOUS | Status: AC

## 2022-01-29 MED ORDER — SODIUM CHLORIDE 0.9 % IV SOLN
500.0000 mg | Freq: Once | INTRAVENOUS | Status: AC
  Administered 2022-01-29: 500 mg via INTRAVENOUS
  Filled 2022-01-29: qty 5

## 2022-01-29 NOTE — Progress Notes (Signed)
ANTICOAGULATION CONSULT NOTE -  Pharmacy Consult for heparin Indication: pulmonary embolus Brief A/P: Heparin level within goal range. Continue Heparin at current rate  No Known Allergies  Patient Measurements: Height: 5\' 10"  (177.8 cm) Weight: 103.2 kg (227 lb 8.2 oz) IBW/kg (Calculated) : 68.5 Heparin Dosing Weight: 90.6kh  Vital Signs: Temp: 97.5 F (36.4 C) (08/28 2042) Temp Source: Oral (08/28 2042) BP: 144/102 (08/28 2042) Pulse Rate: 96 (08/28 2042)  Labs: Recent Labs    01/29/22 1252 01/29/22 1445 01/29/22 1614 01/29/22 1627 01/29/22 1833 01/29/22 2226  HGB 15.0  --  13.3  --   --   --   HCT 44.7  --  39.0  --   --   --   PLT 267  --   --   --   --   --   APTT  --  30  --   --   --   --   LABPROT  --  14.6  --   --   --   --   INR  --  1.1  --   --   --   --   HEPARINUNFRC  --   --   --   --   --  0.46  CREATININE 1.08*  --   --   --   --   --   TROPONINIHS 23*  --   --  25* 26*  --      Estimated Creatinine Clearance: 89.2 mL/min (A) (by C-G formula based on SCr of 1.08 mg/dL (H)).  Assessment: 41 y.o. female with PE for heparin  Goal of Therapy:  Heparin level 0.3-0.7 units/ml Monitor platelets by anticoagulation protocol: Yes   Plan:  Continue Heparin at current rate  Follow-up am labs.  46 01/29/2022,11:34 PM

## 2022-01-29 NOTE — ED Triage Notes (Signed)
Shob and productive cough x 1 week. Concerned she has covid. Was tested at PCP today which was negative. States shob worsens with exertion, and she becomes dizzy and lightheaded. Denies cp, fevers, vomiting, diarrhea.

## 2022-01-29 NOTE — Progress Notes (Signed)
ANTICOAGULATION CONSULT NOTE - Initial Consult  Pharmacy Consult for heparin Indication: pulmonary embolus  No Known Allergies  Patient Measurements: Height: 5\' 10"  (177.8 cm) Weight: 102.1 kg (225 lb) IBW/kg (Calculated) : 68.5 Heparin Dosing Weight: 90.6kh  Vital Signs: Temp: 97.7 F (36.5 C) (08/28 1136) Temp Source: Oral (08/28 1136) BP: 140/108 (08/28 1136) Pulse Rate: 116 (08/28 1136)  Labs: Recent Labs    01/29/22 1252  HGB 15.0  HCT 44.7  PLT 267  CREATININE 1.08*  TROPONINIHS 23*    Estimated Creatinine Clearance: 88.6 mL/min (A) (by C-G formula based on SCr of 1.08 mg/dL (H)).   Medical History: Past Medical History:  Diagnosis Date   Abnormal Pap smear    Herpes simplex type 2 infection    History of chicken pox    History of uterine fibroid    Positive ANA (antinuclear antibody)     Medications:  Infusions:   azithromycin     cefTRIAXone (ROCEPHIN)  IV     heparin     lactated ringers     lactated ringers      Assessment: 41 yof presented to the ED with SOB. Found to have PE with right heart strain and now starting on IV heparin. Baseline CBC is WNL and she is not on anticoagulation PTA.   Goal of Therapy:  Heparin level 0.3-0.7 units/ml Monitor platelets by anticoagulation protocol: Yes   Plan:  Heparin bolus 5500 units IV x 1 Heparin gtt 1600 units/hr Check a 6 hr heparin level Daily heparin level and CBC  Norma Schmidt, 01/31/22 01/29/2022,2:43 PM

## 2022-01-29 NOTE — ED Provider Notes (Signed)
[facility] EMERGENCY DEPARTMENT Provider Note   CSN: [id number] Arrival date & time: 01/29/22  1125     History  Chief Complaint  Patient presents with   Shortness of Breath    Norma Schmidt is a 41 y.o. female.  41 year old female with no significant past medical history presents with complaint of dizziness and shortness of breath.  Patient states her symptoms started about a week ago when she was at a football game, she was walking, felt dizzy and passed out.  Patient states since that time she has been progressively more and more short of breath to the point today when she felt short of breath simply walking to the bathroom which prompted her to go to urgent care who directed her to the emergency room.  Denies any history of asthma or chronic lung disease, is a non-smoker.  Is on a hormonal birth control pill, states that she has migraines from time to time and has as needed meds for that otherwise does not take daily medications.  No recent extended travel, no personal or family history of PE or DVT, no history of cancer, denies leg swelling.       Home Medications Prior to Admission medications   Medication Sig Start Date End Date Taking? Authorizing Provider  JUNEL FE 1.5/30 1.5-30 MG-MCG tablet Take 1 tablet by mouth daily. 04/17/21   [provider]  Multiple Vitamin (MULTIVITAMIN) capsule multivitamin    [provider]  ondansetron (ZOFRAN-ODT) 4 MG disintegrating tablet Take 1 tablet (4 mg total) by mouth every 8 (eight) hours as needed for nausea or vomiting. 05/18/21   Everlena Cooper, Adam R, DO  rizatriptan (MAXALT-MLT) 10 MG disintegrating tablet Take 1 tablet earliest onset of migraine.  May repeat in 2 hours if needed.  Maximum 2 tablets in 24 hours. 05/18/21   Drema Dallas, DO  topiramate (TOPAMAX) 50 MG tablet Take 1 tablet (50 mg total) by mouth at bedtime. 05/18/21   Drema Dallas, DO      Allergies    Patient has no known allergies.     Review of Systems   Review of Systems Negative except as per HPI Physical Exam Updated Vital Signs BP (!) 134/106   Pulse (!) 103   Temp (!) 97.5 F (36.4 C) (Oral)   Resp (!) 24   Ht 5\' 10"  (1.778 m)   Wt 102.1 kg   LMP 01/04/2022 (Exact Date)   SpO2 97%   BMI 32.28 kg/m  Physical Exam Vitals and nursing note reviewed.  Constitutional:      General: She is not in acute distress.    Appearance: She is well-developed. She is not diaphoretic.  HENT:     Head: Normocephalic and atraumatic.  Cardiovascular:     Rate and Rhythm: Regular rhythm. Tachycardia present.     Pulses: Normal pulses.  Pulmonary:     Effort: Tachypnea present.     Breath sounds: No decreased breath sounds, wheezing, rhonchi or rales.  Chest:     Chest wall: No tenderness.  Abdominal:     Palpations: Abdomen is soft.     Tenderness: There is no abdominal tenderness.  Musculoskeletal:     Cervical back: Neck supple.     Right lower leg: No tenderness. No edema.     Left lower leg: No tenderness. No edema.  Skin:    General: Skin is warm and dry.     Findings: No erythema or rash.  Neurological:  Mental Status: She is alert and oriented to person, place, and time.  Psychiatric:        Behavior: Behavior normal.     ED Results / Procedures / Treatments   Labs (all labs ordered are listed, but only abnormal results are displayed) Labs Reviewed  CBC WITH DIFFERENTIAL/PLATELET - Abnormal; Notable for the following components:      Result Value   WBC 11.2 (*)    Neutro Abs 7.8 (*)    All other components within normal limits  BASIC METABOLIC PANEL - Abnormal; Notable for the following components:   CO2 20 (*)    Creatinine, Ser 1.08 (*)    Calcium 8.6 (*)    All other components within normal limits  BRAIN NATRIURETIC PEPTIDE - Abnormal; Notable for the following components:   B Natriuretic Peptide 612.2 (*)    All other components within normal limits  I-STAT ARTERIAL BLOOD GAS, ED -  Abnormal; Notable for the following components:   pCO2 arterial 23.9 (*)    pO2, Arterial 56 (*)    Bicarbonate 16.3 (*)    TCO2 17 (*)    Acid-base deficit 6.0 (*)    Calcium, Ion 1.14 (*)    All other components within normal limits  TROPONIN I (HIGH SENSITIVITY) - Abnormal; Notable for the following components:   Troponin I (High Sensitivity) 23 (*)    All other components within normal limits  TROPONIN I (HIGH SENSITIVITY) - Abnormal; Notable for the following components:   Troponin I (High Sensitivity) 25 (*)    All other components within normal limits  CULTURE, BLOOD (ROUTINE X 2)  CULTURE, BLOOD (ROUTINE X 2)  HCG, QUANTITATIVE, PREGNANCY  PROTIME-INR  APTT  LACTIC ACID, PLASMA  HEPARIN LEVEL (UNFRACTIONATED)  BLOOD GAS, ARTERIAL  TROPONIN I (HIGH SENSITIVITY)    EKG EKG Interpretation  Date/Time:  Monday January 29 2022 11:53:19 EDT Ventricular Rate:  118 PR Interval:  152 QRS Duration: 72 QT Interval:  318 QTC Calculation: 445 R Axis:   134 Text Interpretation: Sinus tachycardia Possible Left atrial enlargement Right axis deviation T wave abnormality, consider anterior ischemia Abnormal ECG No previous ECGs available Confirmed by Ernie Avena ([id number]) on 01/29/2022 12:28:42 PM  Radiology CT Angio Chest PE W and/or Wo Contrast  Result Date: 01/29/2022 CLINICAL DATA:  Shortness of breath and cough.  Chest pain. EXAM: CT ANGIOGRAPHY CHEST WITH CONTRAST TECHNIQUE: Multidetector CT imaging of the chest was performed using the standard protocol during bolus administration of intravenous contrast. Multiplanar CT image reconstructions and MIPs were obtained to evaluate the vascular anatomy. RADIATION DOSE REDUCTION: This exam was performed according to the departmental dose-optimization program which includes automated exposure control, adjustment of the mA and/or kV according to patient size and/or use of iterative reconstruction technique. CONTRAST:  OMNIPAQUE IOHEXOL  350 MG/ML SOLN COMPARISON:  Chest x-ray January 29, 2022 FINDINGS: Cardiovascular: The thoracic aorta is normal. The RV/LV ratio is 1.31. The heart is otherwise normal in appearance. No coronary artery disease identified on limited views. Significant bilateral pulmonary emboli are identified involving the distal left main pulmonary artery, extending into the upper and lower lobe segmental branches. Embolus is also identified in the distal right main pulmonary embolus, extending into upper, middle, and lower lobe branches. Mediastinum/Nodes: No enlarged mediastinal, hilar, or axillary lymph nodes. Thyroid gland, trachea, and esophagus demonstrate no significant findings. Lungs/Pleura: Bilateral pulmonary infiltrates are identified, involving all lobes but most pronounced in the left upper lobe. There is dense  consolidation in the periphery of the left upper lobe, extending to the left hilum, well appreciated on coronal image 47. There are air bronchograms within this dense consolidation which extends to the pleural surface. Elsewhere, there are scattered primarily ground-glass infiltrates located centrally and peripherally. Peripheral ground-glass is identified in the lateral right upper lobe on series 5, image 24. Evaluation for nodules or masses is limited due to the pulmonary infiltrates but no suspicious masses are identified. Upper Abdomen: No acute abnormality. Musculoskeletal: No chest wall abnormality. No acute or significant osseous findings. Review of the MIP images confirms the above findings. IMPRESSION: 1. Extensive pulmonary emboli involving the distal aspects of both main pulmonary arteries and multiple segmental branches with an elevated RV/LV ratio of 1.31. Positive for acute PE with CT evidence of right heart strain (RV/LV Ratio = 1.31) consistent with at least submassive (intermediate risk) PE. The presence of right heart strain has been associated with an increased risk of morbidity and mortality.  Please refer to the "Code PE Focused" order set in EPIC. 2. Bilateral pulmonary infiltrates. Overall, the pattern is that of infection with scattered ground-glass infiltrates. The more focal dense infiltrate in the periphery of the left upper lobe extending to the hilum could represent infection. However, in the setting of pulmonary emboli, developing infarct should be considered. Findings called to Dr. Ernie Avena. Electronically Signed   By: Gerome Sam III M.D.   On: 01/29/2022 14:34   DG Chest 2 View  Result Date: 01/29/2022 CLINICAL DATA:  Shortness of breath and cough. EXAM: CHEST - 2 VIEW COMPARISON:  None Available. FINDINGS: Heart size and mediastinal contours are unremarkable. No pleural effusion identified. Left upper lobe airspace disease is identified concerning for pneumonia. Right lung appears clear. Visualized osseous structures are unremarkable. IMPRESSION: Left upper lobe airspace disease concerning for pneumonia. Followup PA and lateral chest X-ray is recommended in 3-4 weeks following trial of antibiotic therapy to ensure resolution and exclude underlying malignancy. Electronically Signed   By: Signa Kell M.D.   On: 01/29/2022 11:49    Procedures .Critical Care  Performed by: Jeannie Fend, PA-C Authorized by: Jeannie Fend, PA-C   Critical care provider statement:    Critical care time (minutes):  30   Critical care was time spent personally by me on the following activities:  Development of treatment plan with patient or surrogate, discussions with consultants, evaluation of patient's response to treatment, examination of patient, ordering and review of laboratory studies, ordering and review of radiographic studies, ordering and performing treatments and interventions, pulse oximetry, re-evaluation of patient's condition and review of old charts     Medications Ordered in ED Medications  lactated ringers infusion ( Intravenous Restarted 01/29/22 1740)  heparin  ADULT infusion 100 units/mL (25000 units/222mL) (1,600 Units/hr Intravenous New Bag/Given 01/29/22 1531)  0.9 %  sodium chloride infusion (0 mLs Intravenous Stopped 01/29/22 1741)  iohexol (OMNIPAQUE) 350 MG/ML injection 100 mL (100 mLs Intravenous Contrast Given 01/29/22 1400)  cefTRIAXone (ROCEPHIN) 1 g in sodium chloride 0.9 % 100 mL IVPB (0 g Intravenous Stopped 01/29/22 1741)  azithromycin (ZITHROMAX) 500 mg in sodium chloride 0.9 % 250 mL IVPB (0 mg Intravenous Stopped 01/29/22 1647)  lactated ringers bolus 1,000 mL (0 mLs Intravenous Stopped 01/29/22 1627)  heparin bolus via infusion 5,500 Units (5,500 Units Intravenous Bolus from Bag 01/29/22 1532)    ED Course/ Medical Decision Making/ A&P  Medical Decision Making Risk Decision regarding hospitalization.   This patient presents to the ED for concern of shortness of breath, dizziness, syncope, this involves an extensive number of treatment options, and is a complaint that carries with it a high risk of complications and morbidity.  The differential diagnosis includes PE, arrhythmia, pneumonia, lung mass, COVID   Co morbidities that complicate the patient evaluation  migraines   Additional history obtained:  External records from outside source obtained and reviewed including visit to urgent care today where patient was referred to the ER for further work-up.   Lab Tests:  I Ordered, and personally interpreted labs.  The pertinent results include: CBC with mild ketosis white count 11.2, slight increase in neutrophils.  INR normal, hCG negative.  Initial troponin is 23, repeat is 25.  BNP elevated 612.  BMP without significant findings.  Lactic acid negative at 1.1.  ABG with PO2 of 56, requested nasal cannula.   Imaging Studies ordered:  I ordered imaging studies including chest x-ray, CTA PE study I independently visualized and interpreted imaging which showed chest x-ray concerning for left upper lobe  airspace disease/pneumonia, was followed with CTA of the chest for PE showing extensive pulmonary emboli with heart strain, see full report. I agree with the radiologist interpretation   Cardiac Monitoring: / EKG:  The patient was maintained on a cardiac monitor.  I personally viewed and interpreted the cardiac monitored which showed an underlying rhythm of: Sinus tachycardia, rate 118   Consultations Obtained:  I requested consultation with the intensivist, Dr. Henrene Pastor,  and discussed lab and imaging findings as well as pertinent plan - they recommend: admiat to hospitalist service at [facility].   Problem List / ED Course / Critical interventions / Medication management  41 year old female with complaint of dizziness and shortness of breath as above.  Denies fever or respiratory symptoms, leg swelling.  She is on a birth control pill, no other risk factors identified.  She is tachypneic and tachycardic, O2 sats 96 to 100% on room air.  She is found to have a submassive PE with heart strain.  Case was discussed with critical care who will consult, requests hospitalist to admit.  Discussed with hospitalist who will admit and discuss further with critical care.  Patient was started on heparin per protocol, also given antibiotics with concern for secondary pneumonia. I ordered medication including heparin, Zithromax, Rocephin, oxygen for PE, possible pneumonia, tachypnea Reevaluation of the patient after these medicines showed that the patient stayed the same I have reviewed the patients home medicines and have made adjustments as needed   Social Determinants of Health:  Has PCP   Test / Admission - Considered:  Admit for further management         Final Clinical Impression(s) / ED Diagnoses Final diagnoses:  Multiple subsegmental pulmonary emboli without acute cor pulmonale [facility])    Rx / DC Orders ED Discharge Orders     None         Alden Hipp 01/29/22  1819    Tegeler, Canary Brim, MD 01/29/22 [phone]

## 2022-01-29 NOTE — Progress Notes (Addendum)
41 year old on OCPs presented to [facility] with c/o dizziness/syncope last week followed by progressive shortness of breath-sinus tachycardia, EKG T wave inversion,HS trop 28, BNP 600, CT chest with multiple bilateral PE and lung infiltrates-infection versus infarcts with evidence suggesting right heart strain.MEWS currently 4--sinus tachycardia, mild hypothermia, tachypneic at RR 30 but saturating 100% on room air.  [facility] provider discussed with PCCM-Dr. Kendrick Fries who recommended admission to medical service and they will consult.  IV heparin initiated at [facility].  ABG and second set troponin pending. I also personally discussed with Dr. Kendrick Fries regarding above concerning findings and risk of arrhythmias in this young patient with evidence of heart strain/elevated troponin and tachycardia-Dr. Kendrick Fries felt based on his review of imaging and current vital signs that patient should be okay to be admitted to stepdown floor with IV heparin and empiric antibiotics while being closely monitored.  PCCM will evaluate patient tonight and make further recommendations regarding thrombolysis etc. and if ICU transfer warranted.  Please notify PCCM upon patient's arrival. Accepted to stepdown bed at [facility].

## 2022-01-30 ENCOUNTER — Telehealth (HOSPITAL_COMMUNITY): Payer: Self-pay | Admitting: Pharmacy Technician

## 2022-01-30 ENCOUNTER — Inpatient Hospital Stay (HOSPITAL_COMMUNITY): Payer: No Typology Code available for payment source

## 2022-01-30 ENCOUNTER — Encounter (HOSPITAL_COMMUNITY): Payer: Self-pay | Admitting: Internal Medicine

## 2022-01-30 ENCOUNTER — Other Ambulatory Visit (HOSPITAL_COMMUNITY): Payer: Self-pay

## 2022-01-30 DIAGNOSIS — I2699 Other pulmonary embolism without acute cor pulmonale: Secondary | ICD-10-CM | POA: Diagnosis not present

## 2022-01-30 DIAGNOSIS — R918 Other nonspecific abnormal finding of lung field: Secondary | ICD-10-CM | POA: Diagnosis present

## 2022-01-30 DIAGNOSIS — I2609 Other pulmonary embolism with acute cor pulmonale: Secondary | ICD-10-CM

## 2022-01-30 DIAGNOSIS — R778 Other specified abnormalities of plasma proteins: Secondary | ICD-10-CM | POA: Diagnosis present

## 2022-01-30 DIAGNOSIS — Z8669 Personal history of other diseases of the nervous system and sense organs: Secondary | ICD-10-CM

## 2022-01-30 LAB — ECHOCARDIOGRAM COMPLETE
Height: 70 in
S' Lateral: 2.7 cm
Weight: 3640.24 oz

## 2022-01-30 LAB — HEPARIN LEVEL (UNFRACTIONATED)
Heparin Unfractionated: >1.1 IU/mL — ABNORMAL HIGH (ref 0.30–0.70)
Heparin Unfractionated: 0.46 IU/mL (ref 0.30–0.70)

## 2022-01-30 LAB — COMPREHENSIVE METABOLIC PANEL
ALT: 42 U/L (ref 0–44)
AST: 29 U/L (ref 15–41)
Albumin: 2.8 g/dL — ABNORMAL LOW (ref 3.5–5.0)
Alkaline Phosphatase: 45 U/L (ref 38–126)
Anion gap: 6 (ref 5–15)
BUN: 6 mg/dL (ref 6–20)
CO2: 19 mmol/L — ABNORMAL LOW (ref 22–32)
Calcium: 8 mg/dL — ABNORMAL LOW (ref 8.9–10.3)
Chloride: 111 mmol/L (ref 98–111)
Creatinine, Ser: 1.02 mg/dL — ABNORMAL HIGH (ref 0.44–1.00)
GFR, Estimated: >60 mL/min (ref >60)
Glucose, Bld: 100 mg/dL — ABNORMAL HIGH (ref 70–99)
Potassium: 4.2 mmol/L (ref 3.5–5.1)
Sodium: 136 mmol/L (ref 135–145)
Total Bilirubin: 0.5 mg/dL (ref 0.3–1.2)
Total Protein: 5.9 g/dL — ABNORMAL LOW (ref 6.5–8.1)

## 2022-01-30 LAB — PROCALCITONIN: Procalcitonin: <0.1 ng/mL

## 2022-01-30 LAB — CBC
HCT: 39.6 % (ref 36.0–46.0)
Hemoglobin: 13.5 g/dL (ref 12.0–15.0)
MCH: 31 pg (ref 26.0–34.0)
MCHC: 34.1 g/dL (ref 30.0–36.0)
MCV: 91 fL (ref 80.0–100.0)
Platelets: 232 10*3/uL (ref 150–400)
RBC: 4.35 MIL/uL (ref 3.87–5.11)
RDW: 13.2 % (ref 11.5–15.5)
WBC: 9.5 10*3/uL (ref 4.0–10.5)
nRBC: 0 % (ref 0.0–0.2)

## 2022-01-30 LAB — ANTITHROMBIN III: AntiThromb III Func: 70 % — ABNORMAL LOW (ref 75–120)

## 2022-01-30 LAB — PHOSPHORUS: Phosphorus: 2.9 mg/dL (ref 2.5–4.6)

## 2022-01-30 LAB — MAGNESIUM: Magnesium: 2 mg/dL (ref 1.7–2.4)

## 2022-01-30 MED ORDER — METOPROLOL TARTRATE 5 MG/5ML IV SOLN
5.0000 mg | Freq: Four times a day (QID) | INTRAVENOUS | Status: DC | PRN
Start: 2022-01-30 — End: 2022-02-03

## 2022-01-30 MED ORDER — TOPIRAMATE 25 MG PO TABS: 50.0000 mg | ORAL_TABLET | Freq: Every day | ORAL | Status: DC

## 2022-01-30 NOTE — Consult Note (Signed)
NAME:  Norma Schmidt, MRN:  [id number], DOB:  03/14/1981, LOS: 1 ADMISSION DATE:  01/29/2022, CONSULTATION DATE:  8/29 REFERRING MD:  Sunnie Nielsen, CHIEF COMPLAINT:  dyspnea   History of Present Illness:  41 y/o female with minimal past medical history presented with 2 weeks of light headedness and dyspnea, found to have a submassive PE.  She says that she has not been immobilized or ill lately, takes oral contraceptive therapy.  She noticed feeling faint while climbing stairs 2 weeks ago and started to feel short of breath after.  This progressed over 2 weeks so she came to the ER where a CT angiogram was performed and found a submassive PE.  No cough or fever or hemoptysis.  No bleeding since starting on heparin.  Still has some shortness of breath with exertion.  No history of DVT in her family.   Pertinent  Medical History  Abnormal pap smear Uterine fibroid  Significant Hospital Events: Including procedures, antibiotic start and stop dates in addition to other pertinent events   8/28 CT angiogram chest > submassive PE with RV strain RV:LV 1.31  Interim History / Subjective:  As above  Objective   Blood pressure (!) 140/103, pulse (!) 103, temperature 98.2 F (36.8 C), temperature source Oral, resp. rate 16, height 5\' 10"  (1.778 m), weight 103.2 kg, last menstrual period 01/04/2022, SpO2 90 %, currently breastfeeding.        Intake/Output Summary (Last 24 hours) at 01/30/2022 0910 Last data filed at 01/30/2022 0600 Gross per 24 hour  Intake 531.4 ml  Output --  Net 531.4 ml   Filed Weights   01/29/22 1130 01/29/22 2042  Weight: 102.1 kg 103.2 kg    Examination: General:  Resting comfortably in bed HENT: NCAT OP clear PULM: CTA B, normal effort CV: Tachycardic, no mgr GI: BS+, soft, nontender MSK: normal bulk and tone Neuro: awake, alert, no distress, MAEW   Resolved Hospital Problem list     Assessment & Plan:  Provoked, submassive pulmonary embolism with RV strain,  hemodynamically stable -continue heparin -echo today -she is not interested in invasive therapy or TPA based on my discussion of risks and benefits (which are less well known in this population), however she would be willing to reconsider if she worsens -plan 3-6 months of anticoagulation: will add a d-dimer today so we can compare to that when we decide to stop  Best Practice (right click and "Reselect all SmartList Selections" daily)   Per [facility]  Labs   CBC: Recent Labs  Lab 01/29/22 1252 01/29/22 1614 01/30/22 0246  WBC 11.2*  --  9.5  NEUTROABS 7.8*  --   --   HGB 15.0 13.3 13.5  HCT 44.7 39.0 39.6  MCV 89.8  --  91.0  PLT 267  --  232    Basic Metabolic Panel: Recent Labs  Lab 01/29/22 1252 01/29/22 1614 01/29/22 2226 01/30/22 0417  NA 136 137  --  136  K 3.9 4.0  --  4.2  CL 107  --   --  111  CO2 20*  --   --  19*  GLUCOSE 98  --   --  100*  BUN 8  --   --  6  CREATININE 1.08*  --   --  1.02*  CALCIUM 8.6*  --   --  8.0*  MG  --   --  2.1 2.0  PHOS  --   --   --  2.9  GFR: Estimated Creatinine Clearance: 94.4 mL/min (A) (by C-G formula based on SCr of 1.02 mg/dL (H)). Recent Labs  Lab 01/29/22 1252 01/29/22 1445 01/29/22 2226 01/30/22 0246  PROCALCITON  --   --  <0.10  --   WBC 11.2*  --   --  9.5  LATICACIDVEN  --  1.1  --   --     Liver Function Tests: Recent Labs  Lab 01/30/22 0417  AST 29  ALT 42  ALKPHOS 45  BILITOT 0.5  PROT 5.9*  ALBUMIN 2.8*   No results for input(s): "LIPASE", "AMYLASE" in the last 168 hours. No results for input(s): "AMMONIA" in the last 168 hours.  ABG    Component Value Date/Time   PHART 7.440 01/29/2022 1614   PCO2ART 23.9 (L) 01/29/2022 1614   PO2ART 56 (L) 01/29/2022 1614   HCO3 16.3 (L) 01/29/2022 1614   TCO2 17 (L) 01/29/2022 1614   ACIDBASEDEF 6.0 (H) 01/29/2022 1614   O2SAT 91 01/29/2022 1614     Coagulation Profile: Recent Labs  Lab 01/29/22 1445  INR 1.1    Cardiac Enzymes: No results  for input(s): "CKTOTAL", "CKMB", "CKMBINDEX", "TROPONINI" in the last 168 hours.  HbA1C: No results found for: "HGBA1C"  CBG: No results for input(s): "GLUCAP" in the last 168 hours.  Review of Systems:   Gen: Denies fever, chills, weight change, fatigue, night sweats HEENT: Denies blurred vision, double vision, hearing loss, tinnitus, sinus congestion, rhinorrhea, sore throat, neck stiffness, dysphagia PULM: per HPI CV: Denies chest pain, edema, orthopnea, paroxysmal nocturnal dyspnea, palpitations GI: Denies abdominal pain, nausea, vomiting, diarrhea, hematochezia, melena, constipation, change in bowel habits GU: Denies dysuria, hematuria, polyuria, oliguria, urethral discharge Endocrine: Denies hot or cold intolerance, polyuria, polyphagia or appetite change Derm: Denies rash, dry skin, scaling or peeling skin change Heme: Denies easy bruising, bleeding, bleeding gums Neuro: Denies headache, numbness, weakness, slurred speech, loss of memory or consciousness   Past Medical History:  She,  has a past medical history of Abnormal Pap smear, Herpes simplex type 2 infection, History of chicken pox, History of uterine fibroid, and Positive ANA (antinuclear antibody).   Surgical History:   Past Surgical History:  Procedure Laterality Date   CERVICAL CERCLAGE  05/22/2011   Procedure: CERCLAGE CERVICAL;  Surgeon: Purcell Nails, MD;  Location: [facility];  Service: Gynecology;  Laterality: N/A;   COLPOSCOPY     DILATION AND CURETTAGE OF UTERUS     WISDOM TOOTH EXTRACTION       Social History:   reports that she has never smoked. She has never used smokeless tobacco. She reports current alcohol use. She reports that she does not use drugs.   Family History:  Her family history includes Alcohol abuse in her father; Alzheimer's disease in her maternal grandmother; Arthritis in her maternal grandmother; Birth defects in her brother; Cancer in her father and paternal grandmother; Diabetes  in her maternal aunt; Heart disease in her mother; Hypertension in her maternal aunt.   Allergies No Known Allergies   Home Medications  Prior to Admission medications   Medication Sig Start Date End Date Taking? Authorizing Provider  ibuprofen (ADVIL) 200 MG tablet Take 400 mg by mouth every 6 (six) hours as needed for mild pain.   Yes [provider]  JUNEL FE 1.5/30 1.5-30 MG-MCG tablet Take 1 tablet by mouth daily. 04/17/21  Yes [provider]  Multiple Vitamin (MULTIVITAMIN) capsule Take 1 capsule by mouth daily.   Yes [provider]  rizatriptan (MAXALT-MLT) 10 MG disintegrating tablet Take 1 tablet earliest onset of migraine.  May repeat in 2 hours if needed.  Maximum 2 tablets in 24 hours. 05/18/21  Yes Jaffe, Adam R, DO  topiramate (TOPAMAX) 50 MG tablet Take 1 tablet (50 mg total) by mouth at bedtime. Patient not taking: Reported on 01/30/2022 05/18/21   Drema Dallas, DO     Critical care time: n/a    Heber Sobieski, MD [facility] Pager: [phone] Cell: [phone] After 7:00 pm call Elink  [phone]

## 2022-01-30 NOTE — H&P (Signed)
History and Physical    PLEASE NOTE THAT DRAGON DICTATION SOFTWARE WAS USED IN THE CONSTRUCTION OF THIS NOTE.   Norma Schmidt QGB:[id number] DOB: [birth year] DOA: 01/29/2022  PCP: Virginia Rochester, PA  Patient coming from: home   I have personally briefly reviewed patient's old medical records in [place]  Chief Complaint: sob  HPI: Norma Schmidt is a 41 y.o. female with medical history significant for migraines, who is admitted to [facility] on 01/29/2022 by way of transfer from [place] emergency department with acute bilateral pulmonary emboli after presenting from home to the latter facility complaining of shortness of breath.  The patient reports 2 weeks of progressive shortness of breath associate with new onset nonproductive cough.  She also notes a single syncopal episode that occurred 10 days ago, while she was ambulating with her husband.  This episode was associated with immediately preceding dizziness, lightheadedness, and subjective sensation of impending loss of consciousness before the patient actually lost consciousness.  Did not hit her head as component of this fall.  No ensuing syncopal episodes.  She denies any chest pain over the course of the last 2 weeks.  Shortness of breath is not consistent with any orthopnea, PND, or peripheral edema.  No recent calf tenderness or new lower extremity erythema.  Denies any associated subjective fever, chills, rigors, or generalized myalgias.  Denies any personal or family history of DVT/PE.  Not on a blood thinners.  Denies any personal history of GI bleed.  She took a flight to [place] in June 2023, which she states was less than 2 hours duration flight time.  Otherwise, no recent travel.  Denies any recent trauma, surgical procedures, or underlying history of malignancy.  No recent periods of prolonged diminished ovulatory status.  Is on oral contraceptive pills.      [facility] ED Course:   Vital signs in the ED were notable for the following: Afebrile, heart rate low 100s; blood pressure 131/93; respiratory rate 2026, oxygen saturation 96 to 100% on room air.  Labs were notable for the following: BMP notable for creatinine 1.08.  High-sensitivity troponin I initially 23, therapy value trending up slightly to 25.  CBC notable for white cell count 11,200 with 71% neutrophils, hemoglobin 15, platelet count 267.  At INR 1.1.  Lactic acid 1.1.  Blood cultures x2 collected.  Imaging and additional notable ED work-up: EKG showed sinus tachycardia with no evidence of ST elevation.  CTA chest showed evidence of acute bilateral pulmonary emboli along with pulmonary infiltrates, suspicious for infarct versus infection, with CTA evidence of right heart strain.  While in the ED, the following were administered: Heparin bolus followed by initiation heparin drip.  Azithromycin, Rocephin, LR bolus x1 L followed by continuous LR at 150 cc/h.  EDP discussed patient's case with on-call PCCM, Dr. Lake Bells, who recommended admission to the hospitalist service, with plan for PCCM to consult.  Subsequently, the patient was admitted to [facility] for further evaluation management of acute bilateral pulmonary emboli with CTA evidence of right heart strain.    Review of Systems: As per HPI otherwise 10 point review of systems negative.   Past Medical History:  Diagnosis Date   Abnormal Pap smear    Herpes simplex type 2 infection    History of chicken pox    History of uterine fibroid    Positive ANA (antinuclear antibody)     Past Surgical History:  Procedure Laterality Date  CERVICAL CERCLAGE  05/22/2011   Procedure: CERCLAGE CERVICAL;  Surgeon: Delice Lesch, MD;  Location: [place];  Service: Gynecology;  Laterality: N/A;   COLPOSCOPY     DILATION AND CURETTAGE OF UTERUS     WISDOM TOOTH EXTRACTION      Social History:  reports that she has never smoked. She has never used smokeless  tobacco. She reports current alcohol use. She reports that she does not use drugs.   No Known Allergies  Family History  Problem Relation Age of Onset   Alcohol abuse Father    Cancer Father        lung   Diabetes Maternal Aunt    Hypertension Maternal Aunt    Arthritis Maternal Grandmother    Alzheimer's disease Maternal Grandmother    Heart disease Mother        pace maker   Cancer Paternal Grandmother        cervical   Birth defects Brother        premature del at 28weeks    Family history reviewed and not pertinent    Prior to Admission medications   Medication Sig Start Date End Date Taking? Authorizing Provider  JUNEL FE 1.5/30 1.5-30 MG-MCG tablet Take 1 tablet by mouth daily. 04/17/21   [provider]  Multiple Vitamin (MULTIVITAMIN) capsule multivitamin    [provider]  ondansetron (ZOFRAN-ODT) 4 MG disintegrating tablet Take 1 tablet (4 mg total) by mouth every 8 (eight) hours as needed for nausea or vomiting. 05/18/21   Tomi Likens, Adam R, DO  rizatriptan (MAXALT-MLT) 10 MG disintegrating tablet Take 1 tablet earliest onset of migraine.  May repeat in 2 hours if needed.  Maximum 2 tablets in 24 hours. 05/18/21   Pieter Partridge, DO  topiramate (TOPAMAX) 50 MG tablet Take 1 tablet (50 mg total) by mouth at bedtime. 05/18/21   Pieter Partridge, DO     Objective    Physical Exam: Vitals:   01/29/22 1952 01/29/22 2042 01/30/22 0027 01/30/22 0539  BP:  (!) 144/102 (!) 134/101 (!) 142/103  Pulse:  96 (!) 106 99  Resp:  $Remo'20 20 20  'KYgUL$ Temp: 97.9 F (36.6 C) (!) 97.5 F (36.4 C) (!) 97.5 F (36.4 C) 97.7 F (36.5 C)  TempSrc: Oral Oral Oral Oral  SpO2:  98% (!) 88% 94%  Weight:  103.2 kg    Height:  $Remove'5\' 10"'oxRVCVi$  (1.778 m)      General: appears to be stated age; alert, oriented Skin: warm, dry, no rash Head:  AT/[place] Mouth:  Oral mucosa membranes appear moist, normal dentition Neck: supple; trachea midline Heart:  RRR; did not appreciate any M/R/G Lungs:  CTAB, did not appreciate any wheezes, rales, or rhonchi Abdomen: + BS; soft, ND, NT Vascular: 2+ pedal pulses b/l; 2+ radial pulses b/l Extremities: no peripheral edema, no muscle wasting Neuro: strength and sensation intact in upper and lower extremities b/l  Labs on Admission: I have personally reviewed following labs and imaging studies  CBC: Recent Labs  Lab 01/29/22 1252 01/29/22 1614 01/30/22 0246  WBC 11.2*  --  9.5  NEUTROABS 7.8*  --   --   HGB 15.0 13.3 13.5  HCT 44.7 39.0 39.6  MCV 89.8  --  91.0  PLT 267  --  175   Basic Metabolic Panel: Recent Labs  Lab 01/29/22 1252 01/29/22 1614 01/29/22 2226 01/30/22 0417  NA 136 137  --  136  K 3.9 4.0  --  4.2  CL 107  --   --  111  CO2 20*  --   --  19*  GLUCOSE 98  --   --  100*  BUN 8  --   --  6  CREATININE 1.08*  --   --  1.02*  CALCIUM 8.6*  --   --  8.0*  MG  --   --  2.1 2.0  PHOS  --   --   --  2.9   GFR: Estimated Creatinine Clearance: 94.4 mL/min (A) (by C-G formula based on SCr of 1.02 mg/dL (H)). Liver Function Tests: Recent Labs  Lab 01/30/22 0417  AST 29  ALT 42  ALKPHOS 45  BILITOT 0.5  PROT 5.9*  ALBUMIN 2.8*   No results for input(s): "LIPASE", "AMYLASE" in the last 168 hours. No results for input(s): "AMMONIA" in the last 168 hours. Coagulation Profile: Recent Labs  Lab 01/29/22 1445  INR 1.1   Cardiac Enzymes: No results for input(s): "CKTOTAL", "CKMB", "CKMBINDEX", "TROPONINI" in the last 168 hours. BNP (last 3 results) No results for input(s): "PROBNP" in the last 8760 hours. HbA1C: No results for input(s): "HGBA1C" in the last 72 hours. CBG: No results for input(s): "GLUCAP" in the last 168 hours. Lipid Profile: No results for input(s): "CHOL", "HDL", "LDLCALC", "TRIG", "CHOLHDL", "LDLDIRECT" in the last 72 hours. Thyroid Function Tests: No results for input(s): "TSH", "T4TOTAL", "FREET4", "T3FREE", "THYROIDAB" in the last 72 hours. Anemia Panel: No results for input(s):  "VITAMINB12", "FOLATE", "FERRITIN", "TIBC", "IRON", "RETICCTPCT" in the last 72 hours. Urine analysis:    Component Value Date/Time   COLORURINE YELLOW 05/21/2011 2120   APPEARANCEUR CLEAR 05/21/2011 2120   LABSPEC 1.025 05/21/2011 2120   PHURINE 6.0 05/21/2011 2120   GLUCOSEU NEGATIVE 05/21/2011 2120   HGBUR NEGATIVE 05/21/2011 2120   BILIRUBINUR NEGATIVE 05/21/2011 2120   KETONESUR NEGATIVE 05/21/2011 2120   PROTEINUR NEGATIVE 05/21/2011 2120   UROBILINOGEN 0.2 05/21/2011 2120   NITRITE NEGATIVE 05/21/2011 2120   LEUKOCYTESUR NEGATIVE 05/21/2011 2120    Radiological Exams on Admission: CT Angio Chest PE W and/or Wo Contrast  Result Date: 01/29/2022 CLINICAL DATA:  Shortness of breath and cough.  Chest pain. EXAM: CT ANGIOGRAPHY CHEST WITH CONTRAST TECHNIQUE: Multidetector CT imaging of the chest was performed using the standard protocol during bolus administration of intravenous contrast. Multiplanar CT image reconstructions and MIPs were obtained to evaluate the vascular anatomy. RADIATION DOSE REDUCTION: This exam was performed according to the departmental dose-optimization program which includes automated exposure control, adjustment of the mA and/or kV according to patient size and/or use of iterative reconstruction technique. CONTRAST:  170mL OMNIPAQUE IOHEXOL 350 MG/ML SOLN COMPARISON:  Chest x-ray January 29, 2022 FINDINGS: Cardiovascular: The thoracic aorta is normal. The RV/LV ratio is 1.31. The heart is otherwise normal in appearance. No coronary artery disease identified on limited views. Significant bilateral pulmonary emboli are identified involving the distal left main pulmonary artery, extending into the upper and lower lobe segmental branches. Embolus is also identified in the distal right main pulmonary embolus, extending into upper, middle, and lower lobe branches. Mediastinum/Nodes: No enlarged mediastinal, hilar, or axillary lymph nodes. Thyroid gland, trachea, and  esophagus demonstrate no significant findings. Lungs/Pleura: Bilateral pulmonary infiltrates are identified, involving all lobes but most pronounced in the left upper lobe. There is dense consolidation in the periphery of the left upper lobe, extending to the left hilum, well appreciated on coronal image 47. There are air bronchograms within this dense consolidation  which extends to the pleural surface. Elsewhere, there are scattered primarily ground-glass infiltrates located centrally and peripherally. Peripheral ground-glass is identified in the lateral right upper lobe on series 5, image 24. Evaluation for nodules or masses is limited due to the pulmonary infiltrates but no suspicious masses are identified. Upper Abdomen: No acute abnormality. Musculoskeletal: No chest wall abnormality. No acute or significant osseous findings. Review of the MIP images confirms the above findings. IMPRESSION: 1. Extensive pulmonary emboli involving the distal aspects of both main pulmonary arteries and multiple segmental branches with an elevated RV/LV ratio of 1.31. Positive for acute PE with CT evidence of right heart strain (RV/LV Ratio = 1.31) consistent with at least submassive (intermediate risk) PE. The presence of right heart strain has been associated with an increased risk of morbidity and mortality. Please refer to the "Code PE Focused" order set in EPIC. 2. Bilateral pulmonary infiltrates. Overall, the pattern is that of infection with scattered ground-glass infiltrates. The more focal dense infiltrate in the periphery of the left upper lobe extending to the hilum could represent infection. However, in the setting of pulmonary emboli, developing infarct should be considered. Findings called to Dr. Regan Lemming. Electronically Signed   By: Dorise Bullion III M.D.   On: 01/29/2022 14:34   DG Chest 2 View  Result Date: 01/29/2022 CLINICAL DATA:  Shortness of breath and cough. EXAM: CHEST - 2 VIEW COMPARISON:  None  Available. FINDINGS: Heart size and mediastinal contours are unremarkable. No pleural effusion identified. Left upper lobe airspace disease is identified concerning for pneumonia. Right lung appears clear. Visualized osseous structures are unremarkable. IMPRESSION: Left upper lobe airspace disease concerning for pneumonia. Followup PA and lateral chest X-ray is recommended in 3-4 weeks following trial of antibiotic therapy to ensure resolution and exclude underlying malignancy. Electronically Signed   By: Kerby Moors M.D.   On: 01/29/2022 11:49     EKG: Independently reviewed, with result as described above.    Assessment/Plan   Principal Problem:   Acute pulmonary embolism (HCC) Active Problems:   Elevated troponin   Pulmonary infiltrates   History of migraine        #) Acute bilateral pulmonary emboli: In the setting of 2 weeks of progressive shortness of breath and a single episode of syncope, CT chest showed evidence of acute bilateral pulmonary emboli with evidence of right heart strain on CTA.  Risk factors include her use of OCPs.  Of note, no evidence of hypotension to warrant consideration for tPA administration.  Is maintaining O2 sats in the high 90s to 100% on room air.  Will further assess the possibility of right heart strain via echocardiogram in the morning.  Plan: Continue heparin drip.  Echo in the morning.  PCCM to consult, as above.  Monitor strict I's and O's and weights.  Continuous LR, as above.  Repeat CBC in the morning.  Monitor case pulse ox symmetry.       #) Bilateral pulmonary infiltrates: Identified on today CTA chest, felt to be suggestive of pulmonary infarct versus infection.  Clinically, presentation appears less suggestive of underlying infection, and these radiographic findings are felt to be more consistent with pulmonary infiltrates stemming from acute bilateral pulmonary emboli.  Of note, blood cultures x2 were collected prior to initiation of  vasopressor [facility] earlier today.  The patient has been afebrile, and has only a mild elevation in her white blood cell count of 11.2, which does not meet quantitative  threshold for SIRS criteria.  Therefore, SIRS criteria not currently met for sepsis.  Given clinical suspicion that speaks against underlying infection and the patient's hemodynamic stability, will refrain from additional IV antibiotics at this time, but rather check procalcitonin, with consideration for reinitiation of IV antibiotics should this value be elevated.  Plan: Add on procalcitonin level, as above.  Follow-up results of blood cultures x2.  CBC with differential in the morning.        #) Elevated troponin: Very mild elevation in troponin, with initial value noted to be 23, with repeat value trending up slightly to 25.  Appears most consistent with type II supply/demand mismatch in the context of presenting acute bilateral pulmonary emboli, as opposed to representing a type I process due to acute plaque rupture.  Not associate any chest pain, and EKG shows no evidence of ST elevation.  Plan: Continue to trend troponin.  Echocardiogram the morning.  Monitor on telemetry.  Add on serum mag level.  Of note, we will continue heparin drip, although this is for acute bilateral pulmonary emboli as opposed to mildly elevated troponin.        #) History of migraines: Documented history of such, on prophylactic Topamax as an outpatient.  No evidence of acute migraine at this time.  Plan: Continue outpatient prophylactic Topamax.     DVT prophylaxis: Heparin drip, as above Code Status: Full code  Family Communication: I discussed the patient's case with her husband, who is present at bedside Disposition Plan: Per Rounding Team Consults called: EDP at [facility] discussed patient's case with on-call PCCM, who will consult, as further detailed above.;  Admission status: Inpatient    PLEASE  NOTE THAT DRAGON DICTATION SOFTWARE WAS USED IN THE CONSTRUCTION OF THIS NOTE.   [place] DO [facility]  From [place]   01/30/2022, 6:46 AM

## 2022-01-30 NOTE — TOC Benefit Eligibility Note (Signed)
Patient Product/process development scientist completed.    The patient is currently admitted and upon discharge could be taking Eliquis 5 mg.  The current 30 day co-pay is $200.00.   The patient is currently admitted and upon discharge could be taking Xarelto 20 mg.  The current 30 day co-pay is $200.00.   The patient is insured through [employer]    Roland Earl, CPhT Pharmacy Patient Advocate Specialist [facility] Patient Advocate Team Direct Number: [phone]  Fax: [phone]

## 2022-01-30 NOTE — Progress Notes (Signed)
ANTICOAGULATION CONSULT NOTE -  Pharmacy Consult for heparin Indication: pulmonary embolus  No Known Allergies  Patient Measurements: Height: 5\' 10"  (177.8 cm) Weight: 103.2 kg (227 lb 8.2 oz) IBW/kg (Calculated) : 68.5 Heparin Dosing Weight: 90.6kh  Vital Signs: Temp: 98.2 F (36.8 C) (08/29 0759) Temp Source: Oral (08/29 0759) BP: 140/103 (08/29 0759) Pulse Rate: 103 (08/29 0759)  Labs: Recent Labs    01/29/22 1252 01/29/22 1445 01/29/22 1614 01/29/22 1627 01/29/22 1833 01/29/22 2226 01/30/22 0246 01/30/22 0417  HGB 15.0  --  13.3  --   --   --  13.5  --   HCT 44.7  --  39.0  --   --   --  39.6  --   PLT 267  --   --   --   --   --  232  --   APTT  --  30  --   --   --   --   --   --   LABPROT  --  14.6  --   --   --   --   --   --   INR  --  1.1  --   --   --   --   --   --   HEPARINUNFRC  --   --   --   --   --  0.46 >1.10* 0.46  CREATININE 1.08*  --   --   --   --   --   --  1.02*  TROPONINIHS 23*  --   --  25* 26*  --   --   --      Estimated Creatinine Clearance: 94.4 mL/min (A) (by C-G formula based on SCr of 1.02 mg/dL (H)).  Assessment: 41 y.o. female with PE on heparin. Plans noted for 3-6 months of oral anticoagulation -heparin level at goal on 1600 units/hr  Goal of Therapy:  Heparin level 0.3-0.7 units/ml Monitor platelets by anticoagulation protocol: Yes   Plan:  Continue Heparin at current rate  Daily heparin level and CBC Will follow plans for oral anticoagulation  46, PharmD Clinical Pharmacist **Pharmacist phone directory can now be found on [email or web address] ([email or web address]).  Listed under [facility].

## 2022-01-30 NOTE — Telephone Encounter (Signed)
Pharmacy Patient Advocate Encounter  Insurance verification completed.    The patient is insured through [employer]   The patient is currently admitted and ran test claims for the following: Eliquis, Xarelto.  Copays and coinsurance results were relayed to Inpatient clinical team.  

## 2022-01-30 NOTE — Progress Notes (Signed)
PROGRESS NOTE    Norma Schmidt  IFO:[id number] DOB: 06-Apr-1981 DOA: 01/29/2022 PCP: Daylene Katayama, PA   Brief Narrative: 41 year old with past medical history significant for migraine who presents complaining of shortness of breath, progressive over the last 2 weeks associated with nonproductive cough.  She report single syncopal episode 10 days prior to admission.  Syncope was associated with preceding dizziness, lightheadedness.  She was found to have bilateral PE along with pulmonary infiltrates suspicious for infarct versus infection on CTA, also evidence of right heart strain.    Assessment & Plan:   Principal Problem:   Acute pulmonary embolism (HCC) Active Problems:   Elevated troponin   Pulmonary infiltrates   History of migraine   1-Acute bilateral PE, heart strain by CT, Submassive PE Pulmonary infarct versus infiltrate -CTA; Extensive pulmonary emboli involving the distal aspects of both main pulmonary arteries and multiple segmental branches with an elevated RV/LV ratio of 1.31. -Continue with heparin drip -ECHO: Right ventricular systolic function is mildly reduced, right ventricular size is moderately enlarged, moderately elevated pulmonary arterial systolic pressure.  Sent message to CCM that echo is available. -CCM has been consulted.  -Doppler LE; Negative for DVT.  -If she develops worsening tachycardia or Hypoxemia  , will need to inform CCM.  -Hypercoagulable panel ordered.   2-Acute Hypoxic Respiratory Failure:  Continue with oxygen supplementation 2 L.  In setting of sub-massive PE>   Syncope; In setting of PE.   Bilateral Pulmonary Infiltrates vs infarct:  Pro-calcitonin les 0.10 Monitor off antibiotics.   Mild elevation troponin 23--25 ECHO; left ejection fraction 65 to 70% ventricular systolic function is mildly reduced Elevation of troponin in the setting of PE  History of Migraine:  On Topomax.   HTN; will order PRN metoprolol for  SBP more than 160    Estimated body mass index is 32.65 kg/m as calculated from the following:   Height as of this encounter: 5\' 10"  (1.778 m).   Weight as of this encounter: 103.2 kg.   DVT prophylaxis: Heparin gtt Code Status: Full code Family Communication: Care discussed with patient.  Disposition Plan:  Status is: Inpatient Remains inpatient appropriate because: management of PE    Consultants:  CCM  Procedures:  ECHO  Antimicrobials:    Subjective: She  report SOB, better than on admission. She has been having SOB on exertion for last 2 weeks.  We discussed about risk factors for Blood clot and needs for appropriate age screening for malignancy.   Objective: Vitals:   01/29/22 2042 01/30/22 0027 01/30/22 0539 01/30/22 0759  BP: (!) 144/102 (!) 134/101 (!) 142/103 (!) 140/103  Pulse: 96 (!) 106 99 (!) 103  Resp: 20 20 20 16   Temp: (!) 97.5 F (36.4 C) (!) 97.5 F (36.4 C) 97.7 F (36.5 C) 98.2 F (36.8 C)  TempSrc: Oral Oral Oral Oral  SpO2: 98% (!) 88% 94% 90%  Weight: 103.2 kg     Height: 5\' 10"  (1.778 m)       Intake/Output Summary (Last 24 hours) at 01/30/2022 0802 Last data filed at 01/30/2022 0600 Gross per 24 hour  Intake 531.4 ml  Output --  Net 531.4 ml   Filed Weights   01/29/22 1130 01/29/22 2042  Weight: 102.1 kg 103.2 kg    Examination:  General exam: Appears calm and comfortable  Respiratory system: Clear to auscultation. Respiratory effort normal. Cardiovascular system: S1 & S2 heard, RRR. No JVD, murmurs, rubs, gallops or clicks. No pedal edema. Gastrointestinal system:  Abdomen is nondistended, soft and nontender. No organomegaly or masses felt. Normal bowel sounds heard. Central nervous system: Alert and oriented. No focal neurological deficits. Extremities: Symmetric 5 x 5 power. Skin: No rashes, lesions or ulcers Psychiatry: Judgement and insight appear normal. Mood & affect appropriate.     Data Reviewed: I have  personally reviewed following labs and imaging studies  CBC: Recent Labs  Lab 01/29/22 1252 01/29/22 1614 01/30/22 0246  WBC 11.2*  --  9.5  NEUTROABS 7.8*  --   --   HGB 15.0 13.3 13.5  HCT 44.7 39.0 39.6  MCV 89.8  --  91.0  PLT 267  --  232   Basic Metabolic Panel: Recent Labs  Lab 01/29/22 1252 01/29/22 1614 01/29/22 2226 01/30/22 0417  NA 136 137  --  136  K 3.9 4.0  --  4.2  CL 107  --   --  111  CO2 20*  --   --  19*  GLUCOSE 98  --   --  100*  BUN 8  --   --  6  CREATININE 1.08*  --   --  1.02*  CALCIUM 8.6*  --   --  8.0*  MG  --   --  2.1 2.0  PHOS  --   --   --  2.9   GFR: Estimated Creatinine Clearance: 94.4 mL/min (A) (by C-G formula based on SCr of 1.02 mg/dL (H)). Liver Function Tests: Recent Labs  Lab 01/30/22 0417  AST 29  ALT 42  ALKPHOS 45  BILITOT 0.5  PROT 5.9*  ALBUMIN 2.8*   No results for input(s): "LIPASE", "AMYLASE" in the last 168 hours. No results for input(s): "AMMONIA" in the last 168 hours. Coagulation Profile: Recent Labs  Lab 01/29/22 1445  INR 1.1   Cardiac Enzymes: No results for input(s): "CKTOTAL", "CKMB", "CKMBINDEX", "TROPONINI" in the last 168 hours. BNP (last 3 results) No results for input(s): "PROBNP" in the last 8760 hours. HbA1C: No results for input(s): "HGBA1C" in the last 72 hours. CBG: No results for input(s): "GLUCAP" in the last 168 hours. Lipid Profile: No results for input(s): "CHOL", "HDL", "LDLCALC", "TRIG", "CHOLHDL", "LDLDIRECT" in the last 72 hours. Thyroid Function Tests: No results for input(s): "TSH", "T4TOTAL", "FREET4", "T3FREE", "THYROIDAB" in the last 72 hours. Anemia Panel: No results for input(s): "VITAMINB12", "FOLATE", "FERRITIN", "TIBC", "IRON", "RETICCTPCT" in the last 72 hours. Sepsis Labs: Recent Labs  Lab 01/29/22 1445 01/29/22 2226  PROCALCITON  --  <0.10  LATICACIDVEN 1.1  --     Recent Results (from the past 240 hour(s))  Blood culture (routine x 2)     Status:  None (Preliminary result)   Collection Time: 01/29/22  2:46 PM   Specimen: BLOOD  Result Value Ref Range Status   Specimen Description   Final    BLOOD LEFT ANTECUBITAL Performed at [facility], [address] [place], [place] [address]    Special Requests   Final    BOTTLES DRAWN AEROBIC AND ANAEROBIC Blood Culture adequate volume Performed at [facility], [address] [place], [place] [address]    Culture   Final    NO GROWTH < 12 HOURS Performed at [facility], [address] [place], [place] [address]    Report Status PENDING  Incomplete  Blood culture (routine x 2)     Status: None (Preliminary result)   Collection Time: 01/29/22  2:46 PM  Specimen: BLOOD  Result Value Ref Range Status   Specimen Description   Final    BLOOD BLOOD LEFT FOREARM Performed at [facility], [address] [place], [place] [address]    Special Requests   Final    BOTTLES DRAWN AEROBIC AND ANAEROBIC Blood Culture results may not be optimal due to an inadequate volume of blood received in culture bottles Performed at [facility], [address] [place], [place] [address]    Culture   Final    NO GROWTH < 12 HOURS Performed at [facility], [address] [place], [place] [address]    Report Status PENDING  Incomplete         Radiology Studies: CT Angio Chest PE W and/or Wo Contrast  Result Date: 01/29/2022 CLINICAL DATA:  Shortness of breath and cough.  Chest pain. EXAM: CT ANGIOGRAPHY CHEST WITH CONTRAST TECHNIQUE: Multidetector CT imaging of the chest was performed using the standard protocol during bolus administration of intravenous contrast. Multiplanar CT image reconstructions and MIPs were obtained to evaluate the vascular anatomy. RADIATION DOSE REDUCTION: This exam was performed according to the departmental dose-optimization program which includes automated exposure control, adjustment of the mA and/or kV according  to patient size and/or use of iterative reconstruction technique. CONTRAST:  OMNIPAQUE IOHEXOL 350 MG/ML SOLN COMPARISON:  Chest x-ray January 29, 2022 FINDINGS: Cardiovascular: The thoracic aorta is normal. The RV/LV ratio is 1.31. The heart is otherwise normal in appearance. No coronary artery disease identified on limited views. Significant bilateral pulmonary emboli are identified involving the distal left main pulmonary artery, extending into the upper and lower lobe segmental branches. Embolus is also identified in the distal right main pulmonary embolus, extending into upper, middle, and lower lobe branches. Mediastinum/Nodes: No enlarged mediastinal, hilar, or axillary lymph nodes. Thyroid gland, trachea, and esophagus demonstrate no significant findings. Lungs/Pleura: Bilateral pulmonary infiltrates are identified, involving all lobes but most pronounced in the left upper lobe. There is dense consolidation in the periphery of the left upper lobe, extending to the left hilum, well appreciated on coronal image 47. There are air bronchograms within this dense consolidation which extends to the pleural surface. Elsewhere, there are scattered primarily ground-glass infiltrates located centrally and peripherally. Peripheral ground-glass is identified in the lateral right upper lobe on series 5, image 24. Evaluation for nodules or masses is limited due to the pulmonary infiltrates but no suspicious masses are identified. Upper Abdomen: No acute abnormality. Musculoskeletal: No chest wall abnormality. No acute or significant osseous findings. Review of the MIP images confirms the above findings. IMPRESSION: 1. Extensive pulmonary emboli involving the distal aspects of both main pulmonary arteries and multiple segmental branches with an elevated RV/LV ratio of 1.31. Positive for acute PE with CT evidence of right heart strain (RV/LV Ratio = 1.31) consistent with at least submassive (intermediate risk) PE. The  presence of right heart strain has been associated with an increased risk of morbidity and mortality. Please refer to the "Code PE Focused" order set in EPIC. 2. Bilateral pulmonary infiltrates. Overall, the pattern is that of infection with scattered ground-glass infiltrates. The more focal dense infiltrate in the periphery of the left upper lobe extending to the hilum could represent infection. However, in the setting of pulmonary emboli, developing infarct should be considered. Findings called to Dr. Ernie Avena. Electronically Signed   By: Gerome Sam III M.D.   On: 01/29/2022 14:34   DG Chest 2  View  Result Date: 01/29/2022 CLINICAL DATA:  Shortness of breath and cough. EXAM: CHEST - 2 VIEW COMPARISON:  None Available. FINDINGS: Heart size and mediastinal contours are unremarkable. No pleural effusion identified. Left upper lobe airspace disease is identified concerning for pneumonia. Right lung appears clear. Visualized osseous structures are unremarkable. IMPRESSION: Left upper lobe airspace disease concerning for pneumonia. Followup PA and lateral chest X-ray is recommended in 3-4 weeks following trial of antibiotic therapy to ensure resolution and exclude underlying malignancy. Electronically Signed   By: Signa Kell M.D.   On: 01/29/2022 11:49        Scheduled Meds:  topiramate  50 mg Oral QHS   Continuous Infusions:  sodium chloride 10 mL/hr at 01/30/22 0020   heparin 1,600 Units/hr (01/30/22 0352)   lactated ringers 150 mL/hr at 01/29/22 1740     LOS: 1 day    Time spent: 35 minutes.    Alba Cory, MD [facility]   If 7PM-7AM, please contact night-coverage [email or web address]  01/30/2022, 8:02 AM

## 2022-01-30 NOTE — Progress Notes (Signed)
Lower extremity venous duplex has been completed.   Preliminary results in CV Proc.   Norma Schmidt [name] 01/30/2022 2:10 PM

## 2022-01-30 NOTE — Progress Notes (Signed)
Pt ambulated to BR with assist by NT, HR up to 130's while up, Sats stayed >90% on 2.5L/[place]. VS taken upon return to bed BP 143/112 HR 117, sats 95%. MEWS turned yellow. Pt denied any increase in SOB, denied CP or other complaints. VS re-checked BP 140/99, HR 106 sats 95 on 2.5L/[place] and pt denies complaints. MEWS Green. Will continue to monitor. Dierdre Highman, RN

## 2022-01-30 NOTE — Progress Notes (Signed)
  Echocardiogram 2D Echocardiogram has been performed.  Milda Smart 01/30/2022, 10:11 AM

## 2022-01-30 NOTE — Progress Notes (Signed)
Spoke with Dr. Sunnie Nielsen concerning elevated HR and BP. New orders placed. Emelda Brothers RN

## 2022-01-31 ENCOUNTER — Inpatient Hospital Stay (HOSPITAL_COMMUNITY): Payer: No Typology Code available for payment source

## 2022-01-31 ENCOUNTER — Encounter (HOSPITAL_COMMUNITY): Payer: Self-pay | Admitting: Internal Medicine

## 2022-01-31 DIAGNOSIS — I2609 Other pulmonary embolism with acute cor pulmonale: Secondary | ICD-10-CM | POA: Diagnosis not present

## 2022-01-31 HISTORY — PX: IR US GUIDE VASC ACCESS RIGHT: IMG2390

## 2022-01-31 HISTORY — PX: IR INFUSION THROMBOL VENOUS INITIAL (MS): IMG5377

## 2022-01-31 HISTORY — PX: IR INFUSION THROMBOL ARTERIAL INITIAL (MS): IMG5376

## 2022-01-31 HISTORY — PX: IR ANGIOGRAM SELECTIVE EACH ADDITIONAL VESSEL: IMG667

## 2022-01-31 HISTORY — PX: IR ANGIOGRAM PULMONARY BILATERAL SELECTIVE: IMG664

## 2022-01-31 LAB — HEPARIN LEVEL (UNFRACTIONATED)
Heparin Unfractionated: 0.45 IU/mL (ref 0.30–0.70)
Heparin Unfractionated: 0.45 IU/mL (ref 0.30–0.70)

## 2022-01-31 LAB — CBC
HCT: 40.5 % (ref 36.0–46.0)
HCT: 52.8 % — ABNORMAL HIGH (ref 36.0–46.0)
HCT: 42.5 % (ref 36.0–46.0)
Hemoglobin: 13.8 g/dL (ref 12.0–15.0)
Hemoglobin: 17.2 g/dL — ABNORMAL HIGH (ref 12.0–15.0)
Hemoglobin: 14.4 g/dL (ref 12.0–15.0)
MCH: 30.6 pg (ref 26.0–34.0)
MCH: 29.7 pg (ref 26.0–34.0)
MCH: 30.6 pg (ref 26.0–34.0)
MCHC: 34.1 g/dL (ref 30.0–36.0)
MCHC: 32.6 g/dL (ref 30.0–36.0)
MCHC: 33.9 g/dL (ref 30.0–36.0)
MCV: 89.8 fL (ref 80.0–100.0)
MCV: 91.2 fL (ref 80.0–100.0)
MCV: 90.2 fL (ref 80.0–100.0)
Platelets: 271 10*3/uL (ref 150–400)
Platelets: 227 10*3/uL (ref 150–400)
Platelets: 175 10*3/uL (ref 150–400)
RBC: 4.51 MIL/uL (ref 3.87–5.11)
RBC: 5.79 MIL/uL — ABNORMAL HIGH (ref 3.87–5.11)
RBC: 4.71 MIL/uL (ref 3.87–5.11)
RDW: 13 % (ref 11.5–15.5)
RDW: 13 % (ref 11.5–15.5)
RDW: 12.8 % (ref 11.5–15.5)
WBC: 8.2 10*3/uL (ref 4.0–10.5)
WBC: 11.1 10*3/uL — ABNORMAL HIGH (ref 4.0–10.5)
WBC: 10.5 10*3/uL (ref 4.0–10.5)
nRBC: 0 % (ref 0.0–0.2)
nRBC: 0 % (ref 0.0–0.2)
nRBC: 0 % (ref 0.0–0.2)

## 2022-01-31 LAB — FIBRINOGEN
Fibrinogen: 372 mg/dL (ref 210–475)
Fibrinogen: 168 mg/dL — ABNORMAL LOW (ref 210–475)

## 2022-01-31 LAB — MRSA NEXT GEN BY PCR, NASAL: MRSA by PCR Next Gen: NOT DETECTED

## 2022-01-31 LAB — BASIC METABOLIC PANEL
Anion gap: 10 (ref 5–15)
BUN: 7 mg/dL (ref 6–20)
CO2: 20 mmol/L — ABNORMAL LOW (ref 22–32)
Calcium: 8.5 mg/dL — ABNORMAL LOW (ref 8.9–10.3)
Chloride: 108 mmol/L (ref 98–111)
Creatinine, Ser: 1.2 mg/dL — ABNORMAL HIGH (ref 0.44–1.00)
GFR, Estimated: 58 mL/min — ABNORMAL LOW (ref >60)
Glucose, Bld: 93 mg/dL (ref 70–99)
Potassium: 4.5 mmol/L (ref 3.5–5.1)
Sodium: 138 mmol/L (ref 135–145)

## 2022-01-31 LAB — RESP PANEL BY RT-PCR (FLU A&B, COVID) ARPGX2
Influenza A by PCR: NEGATIVE
Influenza B by PCR: NEGATIVE
SARS Coronavirus 2 by RT PCR: NEGATIVE

## 2022-01-31 LAB — CARDIOLIPIN ANTIBODIES, IGG, IGM, IGA
Anticardiolipin IgA: <9 APL U/mL (ref 0–11)
Anticardiolipin IgG: <9 GPL U/mL (ref 0–14)
Anticardiolipin IgM: 29 MPL U/mL — ABNORMAL HIGH (ref 0–12)

## 2022-01-31 LAB — HOMOCYSTEINE: Homocysteine: 9 umol/L (ref 0.0–14.5)

## 2022-01-31 LAB — GLUCOSE, CAPILLARY: Glucose-Capillary: 72 mg/dL (ref 70–99)

## 2022-01-31 MED ORDER — SODIUM CHLORIDE 0.9 % IV SOLN
500.0000 mg | INTRAVENOUS | Status: DC
  Administered 2022-01-31: 500 mg via INTRAVENOUS
  Filled 2022-01-31 (×3): qty 5

## 2022-01-31 MED ORDER — LIDOCAINE-EPINEPHRINE 1 %-1:100000 IJ SOLN
INTRAMUSCULAR | Status: AC
  Filled 2022-01-31: qty 1

## 2022-01-31 MED ORDER — SODIUM CHLORIDE 0.9 % IV SOLN: 250.0000 mL | INTRAVENOUS | Status: DC | PRN

## 2022-01-31 MED ORDER — SODIUM CHLORIDE 0.9% FLUSH
3.0000 mL | Freq: Two times a day (BID) | INTRAVENOUS | Status: DC
  Administered 2022-01-31 – 2022-02-01 (×2): 3 mL via INTRAVENOUS

## 2022-01-31 MED ORDER — SODIUM CHLORIDE 0.9 % IV SOLN
2.0000 g | INTRAVENOUS | Status: DC
  Administered 2022-01-31: 2 g via INTRAVENOUS
  Filled 2022-01-31 (×2): qty 20

## 2022-01-31 MED ORDER — HYDRALAZINE HCL 20 MG/ML IJ SOLN: 10.0000 mg | INTRAMUSCULAR | Status: DC | PRN

## 2022-01-31 MED ORDER — FLUMAZENIL 0.5 MG/5ML IV SOLN
INTRAVENOUS | Status: AC
  Filled 2022-01-31: qty 5

## 2022-01-31 MED ORDER — IOHEXOL 300 MG/ML  SOLN
100.0000 mL | Freq: Once | INTRAMUSCULAR | Status: AC | PRN
  Administered 2022-01-31: 20 mL via INTRAVENOUS

## 2022-01-31 MED ORDER — LIDOCAINE-EPINEPHRINE 2 %-1:100000 IJ SOLN
INTRAMUSCULAR | Status: AC | PRN
  Administered 2022-01-31: 5 mL

## 2022-01-31 MED ORDER — FENTANYL CITRATE (PF) 100 MCG/2ML IJ SOLN
INTRAMUSCULAR | Status: AC | PRN
  Administered 2022-01-31: 50 ug via INTRAVENOUS

## 2022-01-31 MED ORDER — SODIUM CHLORIDE 0.9 % IV SOLN: INTRAVENOUS | Status: DC

## 2022-01-31 MED ORDER — OXYMETAZOLINE HCL 0.05 % NA SOLN
1.0000 | Freq: Two times a day (BID) | NASAL | Status: DC
Start: 2022-01-31 — End: 2022-02-03
  Administered 2022-01-31 – 2022-02-02 (×4): 1 via NASAL
  Filled 2022-01-31 (×2): qty 30

## 2022-01-31 MED ORDER — GUAIFENESIN-DM 100-10 MG/5ML PO SYRP
10.0000 mL | ORAL_SOLUTION | ORAL | Status: DC | PRN
Start: 2022-01-31 — End: 2022-02-03
  Administered 2022-01-31 – 2022-02-01 (×3): 10 mL via ORAL
  Filled 2022-01-31 (×7): qty 10

## 2022-01-31 MED ORDER — SALINE SPRAY 0.65 % NA SOLN: 1.0000 | NASAL | Status: DC | PRN

## 2022-01-31 MED ORDER — NALOXONE HCL 0.4 MG/ML IJ SOLN
INTRAMUSCULAR | Status: AC
  Filled 2022-01-31: qty 1

## 2022-01-31 MED ORDER — CHLORHEXIDINE GLUCONATE CLOTH 2 % EX PADS
6.0000 | MEDICATED_PAD | Freq: Every day | CUTANEOUS | Status: DC
  Administered 2022-01-31 – 2022-02-02 (×3): 6 via TOPICAL

## 2022-01-31 MED ORDER — ALTEPLASE 50 MG IV SOLR
12.0000 mg | Freq: Once | INTRAVENOUS | Status: AC
  Administered 2022-01-31: 12 mg via INTRAVENOUS
  Filled 2022-01-31: qty 12

## 2022-01-31 MED ORDER — FENTANYL CITRATE (PF) 100 MCG/2ML IJ SOLN
INTRAMUSCULAR | Status: AC
  Filled 2022-01-31: qty 2

## 2022-01-31 MED ORDER — MIDAZOLAM HCL 2 MG/2ML IJ SOLN
INTRAMUSCULAR | Status: AC
  Filled 2022-01-31: qty 2

## 2022-01-31 MED ORDER — MIDAZOLAM HCL 2 MG/2ML IJ SOLN
INTRAMUSCULAR | Status: AC | PRN
  Administered 2022-01-31: .5 mg via INTRAVENOUS

## 2022-01-31 MED ORDER — SODIUM CHLORIDE 0.9% FLUSH: 3.0000 mL | INTRAVENOUS | Status: DC | PRN

## 2022-01-31 NOTE — Progress Notes (Signed)
Dr. Elmon Kirschner notified of patient bleeding from right  fem sites. Orders to continue to hold continuous pressure for 5-10 mins, apply pressure dressing and monitor.   Linus Orn RN

## 2022-01-31 NOTE — Progress Notes (Signed)
O2 sats 89-93 % on 2.5 L/[place] but hovering right at 90%. Pt denies increased SOB, no distress noted. Pt states her nose has become stopped up this morning and feels congested. Water bottle added to oxygen for humidification, and increased to 4L/[place]. Rapid Response paged  for a "drive by" assessment, will continue to monitor. Dierdre Highman, RN

## 2022-01-31 NOTE — Progress Notes (Signed)
eLink Physician-Brief Progress Note Patient Name: Norma Schmidt DOB: 1981/05/11 MRN: [id number]   Date of Service  01/31/2022  HPI/Events of Note  Patient request for cough medication.   eICU Interventions  Plan: Robitussin DM 10 mL PO Q 4 hours PRN cough.      Intervention Category Major Interventions: Other:  Norma Schmidt 01/31/2022, 8:09 PM

## 2022-01-31 NOTE — Progress Notes (Signed)
ANTICOAGULATION CONSULT NOTE -  Pharmacy Consult for heparin Indication: pulmonary embolus  No Known Allergies  Patient Measurements: Height: 5\' 10"  (177.8 cm) Weight: 103.2 kg (227 lb 8 oz) IBW/kg (Calculated) : 68.5 Heparin Dosing Weight: 90.6kh  Vital Signs: Temp: 98.3 F (36.8 C) (08/30 0601) Temp Source: Oral (08/30 0601) BP: 142/103 (08/30 0845) Pulse Rate: 115 (08/30 1015)  Labs: Recent Labs    01/29/22 1252 01/29/22 1445 01/29/22 1614 01/29/22 1627 01/29/22 1833 01/29/22 2226 01/30/22 0246 01/30/22 0417 01/31/22 0716  HGB 15.0  --  13.3  --   --   --  13.5  --  13.8  HCT 44.7  --  39.0  --   --   --  39.6  --  40.5  PLT 267  --   --   --   --   --  232  --  271  APTT  --  30  --   --   --   --   --   --   --   LABPROT  --  14.6  --   --   --   --   --   --   --   INR  --  1.1  --   --   --   --   --   --   --   HEPARINUNFRC  --   --   --   --   --    < > >1.10* 0.46 0.45  CREATININE 1.08*  --   --   --   --   --   --  1.02* 1.20*  TROPONINIHS 23*  --   --  25* 26*  --   --   --   --    < > = values in this interval not displayed.     Estimated Creatinine Clearance: 80.3 mL/min (A) (by C-G formula based on SCr of 1.2 mg/dL (H)).  Assessment: 41 y.o. female with PE on heparin. Plans are to consult IR for catheter directed treatment. Per chart notes scans concerning for hemorrhage vs pneumonia -heparin level at goal on 1600 units/hr, CBC stable.   Goal of Therapy:  Heparin level 0.3-0.7 units/ml Monitor platelets by anticoagulation protocol: Yes   Plan:  Continue Heparin at current rate  Daily heparin level and CBC Will follow procedural plans  46, PharmD Clinical Pharmacist **Pharmacist phone directory can now be found on [email or web address] ([email or web address]).  Listed under [facility].

## 2022-01-31 NOTE — Progress Notes (Signed)
ANTICOAGULATION CONSULT NOTE -  Pharmacy Consult for heparin Indication: pulmonary embolus  No Known Allergies  Patient Measurements: Height: 5\' 10"  (177.8 cm) Weight: 103.2 kg (227 lb 8 oz) IBW/kg (Calculated) : 68.5 Heparin Dosing Weight: 90.6kh  Vital Signs: Temp: 98.4 F (36.9 C) (08/30 1912) Temp Source: Oral (08/30 1912) BP: 126/101 (08/30 2000) Pulse Rate: 116 (08/30 2000)  Labs: Recent Labs    01/29/22 1252 01/29/22 1445 01/29/22 1614 01/29/22 1627 01/29/22 1833 01/29/22 2226 01/30/22 0246 01/30/22 0417 01/31/22 0716 01/31/22 1647  HGB 15.0  --    < >  --   --   --  13.5  --  13.8 17.2*  HCT 44.7  --    < >  --   --   --  39.6  --  40.5 52.8*  PLT 267  --   --   --   --   --  232  --  271 227  APTT  --  30  --   --   --   --   --   --   --   --   LABPROT  --  14.6  --   --   --   --   --   --   --   --   INR  --  1.1  --   --   --   --   --   --   --   --   HEPARINUNFRC  --   --   --   --   --    < > >1.10* 0.46 0.45 0.45  CREATININE 1.08*  --   --   --   --   --   --  1.02* 1.20*  --   TROPONINIHS 23*  --   --  25* 26*  --   --   --   --   --    < > = values in this interval not displayed.     Estimated Creatinine Clearance: 80.3 mL/min (A) (by C-G formula based on SCr of 1.2 mg/dL (H)).  Assessment: 41 y.o. female with PE on heparin. Plans are to consult IR for catheter directed treatment. Per chart notes scans concerning for hemorrhage vs pneumonia.   Heparin level continues to be at goal on 1600 units/hr, CBC stable. No bleeding issues noted.    Goal of Therapy:  Heparin level 0.3-0.7 units/ml Monitor platelets by anticoagulation protocol: Yes   Plan:  Continue Heparin at current rate  Daily heparin level and CBC Will follow procedural plans  46 PharmD., BCPS Clinical Pharmacist 01/31/2022 8:13 PM

## 2022-01-31 NOTE — Progress Notes (Signed)
   NAME:  Norma Schmidt, MRN:  [id number], DOB:  01/10/1981, LOS: 2 ADMISSION DATE:  01/29/2022, CONSULTATION DATE:  8/29 REFERRING MD:  Sunnie Nielsen, CHIEF COMPLAINT:  dyspnea   History of Present Illness:  41 y/o female with minimal past medical history presented with 2 weeks of light headedness and dyspnea, found to have a submassive PE.  She says that she has not been immobilized or ill lately, takes oral contraceptive therapy.  She noticed feeling faint while climbing stairs 2 weeks ago and started to feel short of breath after.  This progressed over 2 weeks so she came to the ER where a CT angiogram was performed and found a submassive PE.  No cough or fever or hemoptysis.  No bleeding since starting on heparin.  Still has some shortness of breath with exertion.  No history of DVT in her family.   Pertinent  Medical History  Abnormal pap smear Uterine fibroid  Significant Hospital Events: Including procedures, antibiotic start and stop dates in addition to other pertinent events   8/28 CT angiogram chest > submassive PE with RV strain RV:LV 1.31 8/29 Echo LVEF 65-70%, RVSP 56.7 mmHg, RV moderately enlarged  Interim History / Subjective:  Feels worse Sinus congestion No cough Having a harder time breathing Placed on oxygen overnight (NRB)  Objective   Blood pressure (!) 150/111, pulse (!) 113, temperature 98.3 F (36.8 C), temperature source Oral, resp. rate 17, height 5\' 10"  (1.778 m), weight 103.2 kg, last menstrual period 01/04/2022, SpO2 90 %, currently breastfeeding.       No intake or output data in the 24 hours ending 01/31/22 0957  Filed Weights   01/29/22 1130 01/29/22 2042 01/31/22 0601  Weight: 102.1 kg 103.2 kg 103.2 kg    Examination: General:  Resting in bed, mild distress at rest HENT: NCAT OP clear PULM: CTA B, slightly increased effort, no accessory muscle use CV: Tachycardic, no mgr GI: BS+, soft, nontender MSK: normal bulk and tone Neuro: awake, alert, no  distress, MAEW    Resolved Hospital Problem list     Assessment & Plan:  Provoked, submassive pulmonary embolism with RV strain, hemodynamically stable but as of 8/30 respiratory status is worsening (worsening oxygenation, worsening dyspnea).  She is at very high risk for respiratory failure or death from this condition. Discussed with the patient at length this morning, given her worsening condition we need to proceed with more aggressive therapy.  We discussed systemic thrombolysis vs catheter directed therapy.  Given the infiltrate in her LUL, I'm concerned she may have pulmonary infarct and would be at risk for worsening hemorrhage with a higher dose of systemic thrombolysis.  Clot is not in a good location for thrombectomy.  -consult IR for catheter directed treatment of PE -transfer to ICU -heparin per pharmacy -hold oral contraceptives indefinitely  Acute respiratory failure with hypoxemia -titrate O2 to O2 saturation > 88%  Infiltrates LUL: hemorrhage vs less likely pneumonia -start empiric ceftriaxone/azithro  Sinus congestion -check SARS COV2/FLU -respiratory isolation  Hypertension: Hydralazine prn DBP > 110, SBP > 160  Best Practice (right click and "Reselect all SmartList Selections" daily)   Feeding: NPO for procedure, advance after Analgesia: n/a Sedation: n/a Thromboprophylaxis: heparin infusion HOB >30 degrees Ulcer prophylaxis: n/a Glucose control: monitor   9/30, MD [facility] Pager: [phone] Cell: [phone] After 7:00 pm call Elink  [phone]

## 2022-01-31 NOTE — Procedures (Signed)
Pre procedural Dx: Submassive Pulmonary Embolism Post procedural Dx: Same  Successful initiation of bilateral catheter directed pulmonary arterial thrombolysis.  Access: R CFV x2 - keep right leg straight while catheter are in place.   EBL: None Complications: None immediate  Katherina Right, MD Pager #: [phone]

## 2022-01-31 NOTE — Consult Note (Signed)
Chief Complaint: Patient was seen in consultation today for pulmonary embolus  Referring Physician(s): Dr. Kendrick Fries  Supervising Physician: Simonne Come  Patient Status: [facility] - In-pt  History of Present Illness: Norma Schmidt is a 41 y.o. female with history of uterine fibroid, positive ANA who presented to [facility] ED with 2 week history of lightheadedness and shortness of breath.  She is found to have submassive PE.  ECHO did show McConnell's sign. She was started on IV heparin, however had developed increased O2 requirement overnight.  Assessed by CCM, Dr. Kendrick Fries this AM who recommended IR consult for thrombectomy vs. Lysis.    Norma Schmidt is assessed in Radiology today.  She is on face mask at 15 L/min. Remains tachypneic and tachycardic.  Reports increased nasal congestion, but is able to breath through her nose.  Despite this, she conitnues to have drops in her O2 sats as low as 86% during visit.  She has been approved for lysis by Dr. Grace Isaac who has discussed with the patient.  She is agreeable to proceed.   Past Medical History:  Diagnosis Date   Abnormal Pap smear    Herpes simplex type 2 infection    History of chicken pox    History of uterine fibroid    Positive ANA (antinuclear antibody)     Past Surgical History:  Procedure Laterality Date   CERVICAL CERCLAGE  05/22/2011   Procedure: CERCLAGE CERVICAL;  Surgeon: Purcell Nails, MD;  Location: [facility];  Service: Gynecology;  Laterality: N/A;   COLPOSCOPY     DILATION AND CURETTAGE OF UTERUS     WISDOM TOOTH EXTRACTION      Allergies: Patient has no known allergies.  Medications: Prior to Admission medications   Medication Sig Start Date End Date Taking? Authorizing Provider  ibuprofen (ADVIL) 200 MG tablet Take 400 mg by mouth every 6 (six) hours as needed for mild pain.   Yes [provider]  JUNEL FE 1.5/30 1.5-30 MG-MCG tablet Take 1 tablet by mouth daily. 04/17/21  Yes [provider]   Multiple Vitamin (MULTIVITAMIN) capsule Take 1 capsule by mouth daily.   Yes [provider]  rizatriptan (MAXALT-MLT) 10 MG disintegrating tablet Take 1 tablet earliest onset of migraine.  May repeat in 2 hours if needed.  Maximum 2 tablets in 24 hours. 05/18/21  Yes Jaffe, Adam R, DO  topiramate (TOPAMAX) 50 MG tablet Take 1 tablet (50 mg total) by mouth at bedtime. Patient not taking: Reported on 01/30/2022 05/18/21   Drema Dallas, DO     Family History  Problem Relation Age of Onset   Alcohol abuse Father    Cancer Father        lung   Diabetes Maternal Aunt    Hypertension Maternal Aunt    Arthritis Maternal Grandmother    Alzheimer's disease Maternal Grandmother    Heart disease Mother        pace maker   Cancer Paternal Grandmother        cervical   Birth defects Brother        premature del at 28weeks    Social History   Socioeconomic History   Marital status: Single    Spouse name: Not on file   Number of children: Not on file   Years of education: Not on file   Highest education level: Not on file  Occupational History   Not on file  Tobacco Use   Smoking status: Never   Smokeless tobacco:  Never  Vaping Use   Vaping Use: Never used  Substance and Sexual Activity   Alcohol use: Yes    Comment: occasional   Drug use: No   Sexual activity: Not Currently    Birth control/protection: None  Other Topics Concern   Not on file  Social History Narrative   Right handed   Social Determinants of Health   Financial Resource Strain: Not on file  Food Insecurity: Not on file  Transportation Needs: Not on file  Physical Activity: Not on file  Stress: Not on file  Social Connections: Not on file     Review of Systems: A 12 point ROS discussed and pertinent positives are indicated in the HPI above.  All other systems are negative.  Review of Systems  Constitutional:  Negative for fatigue and fever.  Respiratory:  Positive for shortness of breath.  Negative for cough.   Cardiovascular:  Negative for chest pain.  Gastrointestinal:  Negative for abdominal pain.  Genitourinary:  Negative for dysuria.  Musculoskeletal:  Negative for back pain.  Psychiatric/Behavioral:  Negative for behavioral problems and confusion.     Vital Signs: BP (!) 152/101 (BP Location: Right Arm)   Pulse (!) 116   Temp 98.3 F (36.8 C) (Oral)   Resp 20   Ht 5\' 10"  (1.778 m)   Wt 227 lb 8 oz (103.2 kg)   LMP 01/04/2022 (Exact Date)   SpO2 93%   BMI 32.64 kg/m   Physical Exam Vitals and nursing note reviewed.  Constitutional:      General: She is in acute distress.     Appearance: She is well-developed. She is not ill-appearing.  Cardiovascular:     Rate and Rhythm: Tachycardia present.  Pulmonary:     Effort: Tachypnea present.  Skin:    General: Skin is warm and dry.  Neurological:     General: No focal deficit present.     Mental Status: She is alert and oriented to person, place, and time.  Psychiatric:        Mood and Affect: Mood normal.        Behavior: Behavior normal.      MD Evaluation Airway: WNL Heart: WNL Abdomen: WNL Chest/ Lungs: WNL ASA  Classification: 3 Mallampati/Airway Score: Two   Imaging: VAS 03/06/2022 LOWER EXTREMITY VENOUS (DVT)  Result Date: 01/30/2022  Lower Venous DVT Study Patient Name:  Norma Schmidt  Date of Exam:   01/30/2022 Medical Rec #: [id number]        Accession #:    [id number] Date of Birth: [birth year]         Patient Gender: F Patient Age:   52 years Exam Location:  [facility] Procedure:      VAS [facility] LOWER EXTREMITY VENOUS (DVT) Referring Phys: [place] REGALADO --------------------------------------------------------------------------------  Indications: Pulmonary embolism.  Comparison Study: no prior Performing Technologist: Jon Billings RVS  Examination Guidelines: A complete evaluation includes B-mode imaging, spectral Doppler, color Doppler, and power Doppler as needed of all accessible  portions of each vessel. Bilateral testing is considered an integral part of a complete examination. Limited examinations for reoccurring indications may be performed as noted. The reflux portion of the exam is performed with the patient in reverse Trendelenburg.  +---------+---------------+---------+-----------+----------+--------------+ RIGHT    CompressibilityPhasicitySpontaneityPropertiesThrombus Aging +---------+---------------+---------+-----------+----------+--------------+ CFV      Full           Yes      Yes                                 +---------+---------------+---------+-----------+----------+--------------+  SFJ      Full                                                        +---------+---------------+---------+-----------+----------+--------------+ FV Prox  Full                                                        +---------+---------------+---------+-----------+----------+--------------+ FV Mid   Full                                                        +---------+---------------+---------+-----------+----------+--------------+ FV DistalFull                                                        +---------+---------------+---------+-----------+----------+--------------+ PFV      Full                                                        +---------+---------------+---------+-----------+----------+--------------+ POP      Full           Yes      Yes                                 +---------+---------------+---------+-----------+----------+--------------+ PTV      Full                                                        +---------+---------------+---------+-----------+----------+--------------+ PERO     Full                                                        +---------+---------------+---------+-----------+----------+--------------+   +---------+---------------+---------+-----------+----------+--------------+ LEFT      CompressibilityPhasicitySpontaneityPropertiesThrombus Aging +---------+---------------+---------+-----------+----------+--------------+ CFV      Full           Yes      Yes                                 +---------+---------------+---------+-----------+----------+--------------+ SFJ      Full                                                        +---------+---------------+---------+-----------+----------+--------------+  FV Prox  Full                                                        +---------+---------------+---------+-----------+----------+--------------+ FV Mid   Full                                                        +---------+---------------+---------+-----------+----------+--------------+ FV DistalFull                                                        +---------+---------------+---------+-----------+----------+--------------+ PFV      Full                                                        +---------+---------------+---------+-----------+----------+--------------+ POP      Full           Yes      Yes                                 +---------+---------------+---------+-----------+----------+--------------+ PTV      Full                                                        +---------+---------------+---------+-----------+----------+--------------+ PERO     Full                                                        +---------+---------------+---------+-----------+----------+--------------+     Summary: BILATERAL: - No evidence of deep vein thrombosis seen in the lower extremities, bilaterally. -No evidence of popliteal cyst, bilaterally.   *See table(s) above for measurements and observations. Electronically signed by Waverly Ferrari MD on 01/30/2022 at 4:54:07 PM.    Final    ECHOCARDIOGRAM COMPLETE  Result Date: 01/30/2022    ECHOCARDIOGRAM REPORT   Patient Name:   Norma Schmidt Date of Exam: 01/30/2022 Medical  Rec #:  [id number]       Height:       70.0 in Accession #:    [id number]      Weight:       227.5 lb Date of Birth:  17-Feb-1981        BSA:          2.205 m Patient Age:    41 years        BP:           136/102 mmHg Patient Gender: F  HR:           99 bpm. Exam Location:  Inpatient Procedure: 2D Echo, Cardiac Doppler and Color Doppler Indications:    Pulmonary Embolus I26.09  History:        Patient has prior history of Echocardiogram examinations, most                 recent 05/12/2021.  Sonographer:    Milda Smart Referring Phys: [id number] JUSTIN B HOWERTER  Sonographer Comments: Suboptimal apical window and suboptimal subcostal window. Image acquisition challenging due to patient body habitus. IMPRESSIONS  1. Right ventricular systolic function is mildly reduced; RV McConnell's Sign. The right ventricular size is moderately enlarged. There is moderately elevated pulmonary artery systolic pressure. The estimated right ventricular systolic pressure is 56.7 mmHg.  2. Left ventricular ejection fraction, by estimation, is 65 to 70%. The left ventricle has normal function. The left ventricle has no regional wall motion abnormalities. Left ventricular diastolic parameters were normal. There is the interventricular septum is flattened in diastole ('D' shaped left ventricle), consistent with right ventricular volume overload.  3. A small pericardial effusion is present. No evidence of respirophasic RV collapse, but there is tachycardia, RV changes as noted above, and IVC plethora  4. The mitral valve is normal in structure. No evidence of mitral valve regurgitation. No evidence of mitral stenosis.  5. The aortic valve is tricuspid. Aortic valve regurgitation is not visualized. No aortic stenosis is present. Comparison(s): Unable to see prior study. RV changes appears to be new, there is a small, apical pericardial effusion with abnormal RV function; there is evidence of RV strain. FINDINGS  Left Ventricle:  Left ventricular ejection fraction, by estimation, is 65 to 70%. The left ventricle has normal function. The left ventricle has no regional wall motion abnormalities. The left ventricular internal cavity size was normal in size. There is  no left ventricular hypertrophy. The interventricular septum is flattened in diastole ('D' shaped left ventricle), consistent with right ventricular volume overload. Left ventricular diastolic parameters were normal. Right Ventricle: The right ventricular size is moderately enlarged. No increase in right ventricular wall thickness. Right ventricular systolic function is mildly reduced. There is moderately elevated pulmonary artery systolic pressure. The tricuspid regurgitant velocity is 3.49 m/s, and with an assumed right atrial pressure of 8 mmHg, the estimated right ventricular systolic pressure is 56.7 mmHg. Left Atrium: Left atrial size was normal in size. Right Atrium: Right atrial size was normal in size. Pericardium: A small pericardial effusion is present. There is excessive respiratory variation in septal movement and diastolic collapse of the right ventricular free wall. Mitral Valve: The mitral valve is normal in structure. No evidence of mitral valve regurgitation. No evidence of mitral valve stenosis. Tricuspid Valve: The tricuspid valve is normal in structure. Tricuspid valve regurgitation is mild . No evidence of tricuspid stenosis. Aortic Valve: The aortic valve is tricuspid. Aortic valve regurgitation is not visualized. No aortic stenosis is present. Pulmonic Valve: The pulmonic valve was grossly normal. Pulmonic valve regurgitation is trivial. No evidence of pulmonic stenosis. Aorta: The aortic root and ascending aorta are structurally normal, with no evidence of dilitation. IAS/Shunts: No atrial level shunt detected by color flow Doppler.  LEFT VENTRICLE PLAX 2D LVIDd:         4.40 cm   Diastology LVIDs:         2.70 cm   LV e' medial:  8.27 cm/s LV PW:  1.20 cm   LV e' lateral: 14.30 cm/s LV IVS:        1.00 cm LVOT diam:     2.00 cm LV SV:         36 LV SV Index:   16 LVOT Area:     3.14 cm  RIGHT VENTRICLE             IVC RV S prime:     11.40 cm/s  IVC diam: 2.20 cm TAPSE (M-mode): 1.6 cm LEFT ATRIUM             Index        RIGHT ATRIUM           Index LA diam:        2.40 cm 1.09 cm/m   RA Area:     13.40 cm LA Vol (A2C):   22.9 ml 10.39 ml/m  RA Volume:   35.50 ml  16.10 ml/m LA Vol (A4C):   9.6 ml  4.36 ml/m LA Biplane Vol: 14.6 ml 6.62 ml/m  AORTIC VALVE LVOT Vmax:   79.60 cm/s LVOT Vmean:  53.600 cm/s LVOT VTI:    0.115 m  AORTA Ao Root diam: 3.00 cm Ao Asc diam:  3.10 cm TRICUSPID VALVE TR Peak grad:   48.7 mmHg TR Vmax:        349.00 cm/s  SHUNTS Systemic VTI:  0.12 m Systemic Diam: 2.00 cm Riley Lam MD Electronically signed by Riley Lam MD Signature Date/Time: 01/30/2022/11:15:43 AM    Final    CT Angio Chest PE W and/or Wo Contrast  Result Date: 01/29/2022 CLINICAL DATA:  Shortness of breath and cough.  Chest pain. EXAM: CT ANGIOGRAPHY CHEST WITH CONTRAST TECHNIQUE: Multidetector CT imaging of the chest was performed using the standard protocol during bolus administration of intravenous contrast. Multiplanar CT image reconstructions and MIPs were obtained to evaluate the vascular anatomy. RADIATION DOSE REDUCTION: This exam was performed according to the departmental dose-optimization program which includes automated exposure control, adjustment of the mA and/or kV according to patient size and/or use of iterative reconstruction technique. CONTRAST:  OMNIPAQUE IOHEXOL 350 MG/ML SOLN COMPARISON:  Chest x-ray January 29, 2022 FINDINGS: Cardiovascular: The thoracic aorta is normal. The RV/LV ratio is 1.31. The heart is otherwise normal in appearance. No coronary artery disease identified on limited views. Significant bilateral pulmonary emboli are identified involving the distal left main pulmonary artery, extending  into the upper and lower lobe segmental branches. Embolus is also identified in the distal right main pulmonary embolus, extending into upper, middle, and lower lobe branches. Mediastinum/Nodes: No enlarged mediastinal, hilar, or axillary lymph nodes. Thyroid gland, trachea, and esophagus demonstrate no significant findings. Lungs/Pleura: Bilateral pulmonary infiltrates are identified, involving all lobes but most pronounced in the left upper lobe. There is dense consolidation in the periphery of the left upper lobe, extending to the left hilum, well appreciated on coronal image 47. There are air bronchograms within this dense consolidation which extends to the pleural surface. Elsewhere, there are scattered primarily ground-glass infiltrates located centrally and peripherally. Peripheral ground-glass is identified in the lateral right upper lobe on series 5, image 24. Evaluation for nodules or masses is limited due to the pulmonary infiltrates but no suspicious masses are identified. Upper Abdomen: No acute abnormality. Musculoskeletal: No chest wall abnormality. No acute or significant osseous findings. Review of the MIP images confirms the above findings. IMPRESSION: 1. Extensive pulmonary emboli involving the distal aspects of both main pulmonary arteries  and multiple segmental branches with an elevated RV/LV ratio of 1.31. Positive for acute PE with CT evidence of right heart strain (RV/LV Ratio = 1.31) consistent with at least submassive (intermediate risk) PE. The presence of right heart strain has been associated with an increased risk of morbidity and mortality. Please refer to the "Code PE Focused" order set in EPIC. 2. Bilateral pulmonary infiltrates. Overall, the pattern is that of infection with scattered ground-glass infiltrates. The more focal dense infiltrate in the periphery of the left upper lobe extending to the hilum could represent infection. However, in the setting of pulmonary emboli,  developing infarct should be considered. Findings called to Dr. Ernie Avena. Electronically Signed   By: Gerome Sam III M.D.   On: 01/29/2022 14:34   DG Chest 2 View  Result Date: 01/29/2022 CLINICAL DATA:  Shortness of breath and cough. EXAM: CHEST - 2 VIEW COMPARISON:  None Available. FINDINGS: Heart size and mediastinal contours are unremarkable. No pleural effusion identified. Left upper lobe airspace disease is identified concerning for pneumonia. Right lung appears clear. Visualized osseous structures are unremarkable. IMPRESSION: Left upper lobe airspace disease concerning for pneumonia. Followup PA and lateral chest X-ray is recommended in 3-4 weeks following trial of antibiotic therapy to ensure resolution and exclude underlying malignancy. Electronically Signed   By: Signa Kell M.D.   On: 01/29/2022 11:49    Labs:  CBC: Recent Labs    01/29/22 1252 01/29/22 1614 01/30/22 0246 01/31/22 0716  WBC 11.2*  --  9.5 8.2  HGB 15.0 13.3 13.5 13.8  HCT 44.7 39.0 39.6 40.5  PLT 267  --  232 271    COAGS: Recent Labs    01/29/22 1445  INR 1.1  APTT 30    BMP: Recent Labs    01/29/22 1252 01/29/22 1614 01/30/22 0417 01/31/22 0716  NA 136 137 136 138  K 3.9 4.0 4.2 4.5  CL 107  --  111 108  CO2 20*  --  19* 20*  GLUCOSE 98  --  100* 93  BUN 8  --  6 7  CALCIUM 8.6*  --  8.0* 8.5*  CREATININE 1.08*  --  1.02* 1.20*  GFRNONAA >60  --  >60 58*    LIVER FUNCTION TESTS: Recent Labs    01/30/22 0417  BILITOT 0.5  AST 29  ALT 42  ALKPHOS 45  PROT 5.9*  ALBUMIN 2.8*    TUMOR MARKERS: No results for input(s): "AFPTM", "CEA", "CA199", "CHROMGRNA" in the last 8760 hours.  Assessment and Plan: Pulmonary embolus Patient presents with submassive PE, R heart strain.  Troponins 26 on admission.  Increased O2 requirement overnight-- now on 15L venturi mask with increased WOB.  IR consulted for lysis vs. Thrombectomy.  Dr. Grace Isaac has reviewed case and approves  for lysis.  Patient ate breakfast this AM, however is at risk for further decline.  Proceed with thrombolysis.   Patient to transfer to ICU post-procedure.  Dr. Kendrick Fries and bed control aware.  Risks and benefits discussed with the patient including, but not limited to bleeding, possible life threatening bleeding and need for blood product transfusion, vascular injury, stroke, contrast induced renal failure, limb loss and infection.  All of the patient's questions were answered, patient is agreeable to proceed. Consent signed and in chart.   Thank you for this interesting consult.  I greatly enjoyed meeting Norma Schmidt and look forward to participating in their care.  A copy of this report was sent to  the requesting provider on this date.  Electronically Signed: Hoyt KochKacie Sue-Ellen Jess Sulak, PA 01/31/2022, 11:34 AM   I spent a total of 40 Minutes    in face to face in clinical consultation, greater than 50% of which was counseling/coordinating care for pulmonary embolus.

## 2022-01-31 NOTE — Significant Event (Signed)
Rapid Response Event Note   Reason for Call :  Increased O2 demands.   Initial Focused Assessment:  On arrival, pt sitting up in bed. No c/o pain or sob. Lungs clear/diminished throughout. Pt states her nose is dry from the [place]. Was previously on 2.5L (on 4L [place] now). spO2 93%. Primary RN placed humidified O2 prior to my arrival.   Interventions:  O2 increased PTA.  Plan of Care:  Continue to monitor respiratory status and O2 needs.  RN instructed to call with any changes or concerns.    Event Summary:   MD Notified: n/a Call Time: 0632 Arrival Time: 6387 End Time: 5643  Mordecai Rasmussen, RN

## 2022-02-01 ENCOUNTER — Inpatient Hospital Stay (HOSPITAL_COMMUNITY): Payer: No Typology Code available for payment source

## 2022-02-01 DIAGNOSIS — J9601 Acute respiratory failure with hypoxia: Secondary | ICD-10-CM | POA: Diagnosis not present

## 2022-02-01 DIAGNOSIS — I2699 Other pulmonary embolism without acute cor pulmonale: Secondary | ICD-10-CM

## 2022-02-01 DIAGNOSIS — I2694 Multiple subsegmental pulmonary emboli without acute cor pulmonale: Secondary | ICD-10-CM | POA: Diagnosis not present

## 2022-02-01 HISTORY — PX: IR THROMB F/U EVAL ART/VEN FINAL DAY (MS): IMG5379

## 2022-02-01 LAB — CBC
HCT: 43.4 % (ref 36.0–46.0)
HCT: 36.4 % (ref 36.0–46.0)
HCT: 40.1 % (ref 36.0–46.0)
Hemoglobin: 14.3 g/dL (ref 12.0–15.0)
Hemoglobin: 12 g/dL (ref 12.0–15.0)
Hemoglobin: 13.5 g/dL (ref 12.0–15.0)
MCH: 29.9 pg (ref 26.0–34.0)
MCH: 30.2 pg (ref 26.0–34.0)
MCH: 30.4 pg (ref 26.0–34.0)
MCHC: 32.9 g/dL (ref 30.0–36.0)
MCHC: 33 g/dL (ref 30.0–36.0)
MCHC: 33.7 g/dL (ref 30.0–36.0)
MCV: 90.8 fL (ref 80.0–100.0)
MCV: 91.5 fL (ref 80.0–100.0)
MCV: 90.3 fL (ref 80.0–100.0)
Platelets: 184 10*3/uL (ref 150–400)
Platelets: 146 10*3/uL — ABNORMAL LOW (ref 150–400)
Platelets: 145 10*3/uL — ABNORMAL LOW (ref 150–400)
RBC: 4.78 MIL/uL (ref 3.87–5.11)
RBC: 3.98 MIL/uL (ref 3.87–5.11)
RBC: 4.44 MIL/uL (ref 3.87–5.11)
RDW: 13 % (ref 11.5–15.5)
RDW: 13 % (ref 11.5–15.5)
RDW: 13 % (ref 11.5–15.5)
WBC: 10.4 10*3/uL (ref 4.0–10.5)
WBC: 11.5 10*3/uL — ABNORMAL HIGH (ref 4.0–10.5)
WBC: 11 10*3/uL — ABNORMAL HIGH (ref 4.0–10.5)
nRBC: 0 % (ref 0.0–0.2)
nRBC: 0 % (ref 0.0–0.2)
nRBC: 0 % (ref 0.0–0.2)

## 2022-02-01 LAB — COMPREHENSIVE METABOLIC PANEL
ALT: 63 U/L — ABNORMAL HIGH (ref 0–44)
AST: 44 U/L — ABNORMAL HIGH (ref 15–41)
Albumin: 2.6 g/dL — ABNORMAL LOW (ref 3.5–5.0)
Alkaline Phosphatase: 46 U/L (ref 38–126)
Anion gap: 7 (ref 5–15)
BUN: 7 mg/dL (ref 6–20)
CO2: 19 mmol/L — ABNORMAL LOW (ref 22–32)
Calcium: 7.8 mg/dL — ABNORMAL LOW (ref 8.9–10.3)
Chloride: 111 mmol/L (ref 98–111)
Creatinine, Ser: 1.07 mg/dL — ABNORMAL HIGH (ref 0.44–1.00)
GFR, Estimated: >60 mL/min (ref >60)
Glucose, Bld: 103 mg/dL — ABNORMAL HIGH (ref 70–99)
Potassium: 4.4 mmol/L (ref 3.5–5.1)
Sodium: 137 mmol/L (ref 135–145)
Total Bilirubin: 0.5 mg/dL (ref 0.3–1.2)
Total Protein: 5.8 g/dL — ABNORMAL LOW (ref 6.5–8.1)

## 2022-02-01 LAB — HEPARIN LEVEL (UNFRACTIONATED)
Heparin Unfractionated: 0.36 IU/mL (ref 0.30–0.70)
Heparin Unfractionated: 0.23 IU/mL — ABNORMAL LOW (ref 0.30–0.70)
Heparin Unfractionated: 0.47 IU/mL (ref 0.30–0.70)
Heparin Unfractionated: 0.54 IU/mL (ref 0.30–0.70)

## 2022-02-01 LAB — FIBRINOGEN
Fibrinogen: 178 mg/dL — ABNORMAL LOW (ref 210–475)
Fibrinogen: 162 mg/dL — ABNORMAL LOW (ref 210–475)

## 2022-02-01 LAB — MAGNESIUM: Magnesium: 2 mg/dL (ref 1.7–2.4)

## 2022-02-01 LAB — BETA-2-GLYCOPROTEIN I ABS, IGG/M/A
Beta-2 Glyco I IgG: <9 GPI IgG units (ref 0–20)
Beta-2-Glycoprotein I IgA: <9 GPI IgA units (ref 0–25)
Beta-2-Glycoprotein I IgM: <9 GPI IgM units (ref 0–32)

## 2022-02-01 LAB — TYPE AND SCREEN
ABO/RH(D): B POS
Antibody Screen: NEGATIVE

## 2022-02-01 LAB — PROCALCITONIN: Procalcitonin: <0.1 ng/mL

## 2022-02-01 MED ORDER — THROMBI-PAD 3"X3" EX PADS
1.0000 | MEDICATED_PAD | Freq: Once | CUTANEOUS | Status: AC
Start: 2022-02-01 — End: 2022-02-01
  Administered 2022-02-01: 1 via TOPICAL
  Filled 2022-02-01: qty 1

## 2022-02-01 MED ORDER — SODIUM CHLORIDE 0.9 % IV SOLN: 250.0000 mL | INTRAVENOUS | Status: DC

## 2022-02-01 MED ORDER — NOREPINEPHRINE 4 MG/250ML-% IV SOLN
2.0000 ug/min | INTRAVENOUS | Status: DC
  Administered 2022-02-01: 10 ug/min via INTRAVENOUS

## 2022-02-01 MED ORDER — NOREPINEPHRINE 4 MG/250ML-% IV SOLN: 0.0000 ug/min | INTRAVENOUS | Status: DC

## 2022-02-01 MED ORDER — NOREPINEPHRINE 4 MG/250ML-% IV SOLN
INTRAVENOUS | Status: AC
  Filled 2022-02-01: qty 250

## 2022-02-01 MED ORDER — SODIUM CHLORIDE 0.9 % IV BOLUS
1000.0000 mL | Freq: Once | INTRAVENOUS | Status: AC
  Administered 2022-02-01: 1000 mL via INTRAVENOUS

## 2022-02-01 NOTE — Progress Notes (Signed)
ANTICOAGULATION CONSULT NOTE - Follow Up Consult  Pharmacy Consult for heparin Indication: pulmonary embolus (with concurrent bilateral catheter directed pulmonary arterial thrombolysis)  Labs: Recent Labs    01/29/22 1252 01/29/22 1445 01/29/22 1614 01/29/22 1627 01/29/22 1833 01/29/22 2226 01/30/22 0417 01/31/22 0716 01/31/22 1647 01/31/22 2305 02/01/22 0218  HGB 15.0  --    < >  --   --    < >  --  13.8 17.2* 14.4  --   HCT 44.7  --    < >  --   --    < >  --  40.5 52.8* 42.5  --   PLT 267  --   --   --   --    < >  --  271 227 175  --   APTT  --  30  --   --   --   --   --   --   --   --   --   LABPROT  --  14.6  --   --   --   --   --   --   --   --   --   INR  --  1.1  --   --   --   --   --   --   --   --   --   HEPARINUNFRC  --   --   --   --   --    < > 0.46 0.45 0.45  --  0.36  CREATININE 1.08*  --   --   --   --   --  1.02* 1.20*  --   --   --   TROPONINIHS 23*  --   --  25* 26*  --   --   --   --   --   --    < > = values in this interval not displayed.    Assessment/Plan:  41yo female remains therapeutic on heparin with lower heparin level now than earlier though pt has been continuously bleedinh from right fem site. Will continue infusion at current rate of 1600 units/hr and confirm stable with next scheduled Q6H level.   Vernard Gambles, PharmD, BCPS  02/01/2022,3:42 AM

## 2022-02-01 NOTE — Progress Notes (Signed)
Patients name and DOB were identified. The left catheter was measured 74/45(57) then pulled back and measured 71/43(55). The right was measured 73/70(73) then pulled back 61/47(54). The catheters were removed and pressured was held for 20 minutes with a quik clot and pressure dressing applied. Hemostasis was reached and the RN checked the site.    Cumi Sanagustin RT (R)

## 2022-02-01 NOTE — Progress Notes (Signed)
Left ac IV removed this am held pressure, pressure DRSG applied. Area continued to have some bleeding, CCM made aware. Thrombi- pad applied and site redressed.

## 2022-02-01 NOTE — Progress Notes (Signed)
NAME:  Norma Schmidt, MRN:  [id number], DOB:  June 30, 1980, LOS: 3 ADMISSION DATE:  01/29/2022, CONSULTATION DATE:  8/29 REFERRING MD:  Tyrell Antonio, CHIEF COMPLAINT:  dyspnea   History of Present Illness:  41 y/o female with minimal past medical history presented with 2 weeks of light headedness and dyspnea, found to have a submassive PE.  She says that she has not been immobilized or ill lately, takes oral contraceptive therapy.  She noticed feeling faint while climbing stairs 2 weeks ago and started to feel short of breath after.  This progressed over 2 weeks so she came to the ER where a CT angiogram was performed and found a submassive PE.  No cough or fever or hemoptysis.  No bleeding since starting on heparin.  Still has some shortness of breath with exertion.  No history of DVT in her family.   Pertinent  Medical History  Abnormal pap smear Uterine fibroid  Significant Hospital Events: Including procedures, antibiotic start and stop dates in addition to other pertinent events   8/28 CT angiogram chest > submassive PE with RV strain RV:LV 1.31 8/29 Echo LVEF 65-70%, RVSP 56.7 mmHg, RV moderately enlarged 8/30 bilateral catheter directed pulmonary arterial thrombolysis with IR  Interim History / Subjective:  Patient seen and evaluated at the bedside this morning. Her breathing feels improved and she is not having any pain at the catheter site. She denies any chest pain.   While changing her dressing (due to active bleeding), the patient had a vasovagal response and became hypotensive and bradycardic - she felt lightheaded during this time, but did not lose consciousness. After levo and IVF were started, BP and HR improved and the patient felt better.   Objective   Blood pressure (!) 129/94, pulse (!) 103, temperature 98.2 F (36.8 C), temperature source Oral, resp. rate 15, height 5\' 10"  (1.778 m), weight 104.7 kg, last menstrual period 01/04/2022, SpO2 96 %, currently breastfeeding.     FiO2 (%):  [55 %] 55 %   Intake/Output Summary (Last 24 hours) at 02/01/2022 0623 Last data filed at 02/01/2022 0600 Gross per 24 hour  Intake 2436.62 ml  Output 100 ml  Net 2336.62 ml   Filed Weights   01/29/22 2042 01/31/22 0601 02/01/22 0500  Weight: 103.2 kg 103.2 kg 104.7 kg    Examination: General: resting in bed, no acute distress. HENT: [place]/AT. Sclera nonicteric.  Lungs: CTAB, slightly increased work of breathing on [place].  Cardiovascular: Tachycardic rate, regular rhythm. No murmurs Abdomen: Soft, nontender, +BS Extremities: No edema. Thrombolysis catheter in R groin  Neuro: Alert and oriented x4, no focal deficits  Resolved Hospital Problem list     Assessment & Plan:  Provoked, submassive pulmonary embolism  Noted to have extensive bilateral PE on CTA with RV strain (submassive). Patient remained hemodynamically stable, however, on 8/30 her respiratory status was worsening and decision was made to pursue catheter directed thrombolysis.  - Patient underwent bilateral catheter directed pulmonary arterial thromboylsis with IR on 8/30 - Continue heparin  - Recheck CBC - Hold oral contraceptives indefinitely  Vasovagal response Patient had vasovagal response this morning (likely secondary to pain from applied pressure to cath site) and became bradycardic and hypotensive. Levophed started at 10 mcg and 2 L IVF bolus given. Patient stabilized.  - Levophed discontinued  Acute respiratory failure with hypoxemia - Maintain O2 sat >92% - [place] as able  LUL infiltrate 2/2 infarct Procalcitonin negative, no signs of PNA. LUL infiltrate is secondary to  infarct from PE.  - Discontinue empiric ceftriaxone/azithro  Hypertension - PRN hydralazine for SBP>160, DBP>110  Best Practice (right click and "Reselect all SmartList Selections" daily)   Diet/type: clear liquids DVT prophylaxis: systemic heparin GI prophylaxis: N/A Lines: N/A Foley:  N/A Code Status:  full  code Last date of multidisciplinary goals of care discussion [updated patient at bedside 8/31]  Labs   CBC: Recent Labs  Lab 01/29/22 1252 01/29/22 1614 01/30/22 0246 01/31/22 0716 01/31/22 1647 01/31/22 2305 02/01/22 0435  WBC 11.2*  --  9.5 8.2 11.1* 10.5 10.4  NEUTROABS 7.8*  --   --   --   --   --   --   HGB 15.0   < > 13.5 13.8 17.2* 14.4 14.3  HCT 44.7   < > 39.6 40.5 52.8* 42.5 43.4  MCV 89.8  --  91.0 89.8 91.2 90.2 90.8  PLT 267  --  232 271 227 175 184   < > = values in this interval not displayed.    Basic Metabolic Panel: Recent Labs  Lab 01/29/22 1252 01/29/22 1614 01/29/22 2226 01/30/22 0417 01/31/22 0716 02/01/22 0435  NA 136 137  --  136 138 137  K 3.9 4.0  --  4.2 4.5 4.4  CL 107  --   --  111 108 111  CO2 20*  --   --  19* 20* 19*  GLUCOSE 98  --   --  100* 93 103*  BUN 8  --   --  6 7 7   CREATININE 1.08*  --   --  1.02* 1.20* 1.07*  CALCIUM 8.6*  --   --  8.0* 8.5* 7.8*  MG  --   --  2.1 2.0  --   --   PHOS  --   --   --  2.9  --   --    GFR: Estimated Creatinine Clearance: 90.7 mL/min (A) (by C-G formula based on SCr of 1.07 mg/dL (H)). Recent Labs  Lab 01/29/22 1445 01/29/22 2226 01/30/22 0246 01/31/22 0716 01/31/22 1647 01/31/22 2305 02/01/22 0435  PROCALCITON  --  <0.10  --   --   --   --   --   WBC  --   --    < > 8.2 11.1* 10.5 10.4  LATICACIDVEN 1.1  --   --   --   --   --   --    < > = values in this interval not displayed.    Liver Function Tests: Recent Labs  Lab 01/30/22 0417 02/01/22 0435  AST 29 44*  ALT 42 63*  ALKPHOS 45 46  BILITOT 0.5 0.5  PROT 5.9* 5.8*  ALBUMIN 2.8* 2.6*   No results for input(s): "LIPASE", "AMYLASE" in the last 168 hours. No results for input(s): "AMMONIA" in the last 168 hours.  ABG    Component Value Date/Time   PHART 7.440 01/29/2022 1614   PCO2ART 23.9 (L) 01/29/2022 1614   PO2ART 56 (L) 01/29/2022 1614   HCO3 16.3 (L) 01/29/2022 1614   TCO2 17 (L) 01/29/2022 1614    ACIDBASEDEF 6.0 (H) 01/29/2022 1614   O2SAT 91 01/29/2022 1614     Coagulation Profile: Recent Labs  Lab 01/29/22 1445  INR 1.1    Cardiac Enzymes: No results for input(s): "CKTOTAL", "CKMB", "CKMBINDEX", "TROPONINI" in the last 168 hours.  HbA1C: No results found for: "HGBA1C"  CBG: Recent Labs  Lab 01/31/22 1909  GLUCAP 72  Review of Systems:   As above  Past Medical History:  She,  has a past medical history of Abnormal Pap smear, Herpes simplex type 2 infection, History of chicken pox, History of uterine fibroid, and Positive ANA (antinuclear antibody).   Surgical History:   Past Surgical History:  Procedure Laterality Date   CERVICAL CERCLAGE  05/22/2011   Procedure: CERCLAGE CERVICAL;  Surgeon: Purcell Nails, MD;  Location: [facility];  Service: Gynecology;  Laterality: N/A;   COLPOSCOPY     DILATION AND CURETTAGE OF UTERUS     IR ANGIOGRAM PULMONARY BILATERAL SELECTIVE  01/31/2022   IR ANGIOGRAM SELECTIVE EACH ADDITIONAL VESSEL  01/31/2022   IR ANGIOGRAM SELECTIVE EACH ADDITIONAL VESSEL  01/31/2022   IR INFUSION THROMBOL ARTERIAL INITIAL (MS)  01/31/2022   IR INFUSION THROMBOL VENOUS INITIAL (MS)  01/31/2022   IR US GUIDE VASC ACCESS RIGHT  01/31/2022   WISDOM TOOTH EXTRACTION       Social History:   reports that she has never smoked. She has never used smokeless tobacco. She reports current alcohol use. She reports that she does not use drugs.   Family History:  Her family history includes Alcohol abuse in her father; Alzheimer's disease in her maternal grandmother; Arthritis in her maternal grandmother; Birth defects in her brother; Cancer in her father and paternal grandmother; Diabetes in her maternal aunt; Heart disease in her mother; Hypertension in her maternal aunt.   Allergies No Known Allergies   Home Medications  Prior to Admission medications   Medication Sig Start Date End Date Taking? Authorizing Provider  ibuprofen (ADVIL) 200 MG tablet Take  400 mg by mouth every 6 (six) hours as needed for mild pain.   Yes [provider]  JUNEL FE 1.5/30 1.5-30 MG-MCG tablet Take 1 tablet by mouth daily. 04/17/21  Yes [provider]  Multiple Vitamin (MULTIVITAMIN) capsule Take 1 capsule by mouth daily.   Yes [provider]  rizatriptan (MAXALT-MLT) 10 MG disintegrating tablet Take 1 tablet earliest onset of migraine.  May repeat in 2 hours if needed.  Maximum 2 tablets in 24 hours. 05/18/21  Yes Jaffe, Adam R, DO  topiramate (TOPAMAX) 50 MG tablet Take 1 tablet (50 mg total) by mouth at bedtime. Patient not taking: Reported on 01/30/2022 05/18/21   Drema Dallas, DO     Critical care time: 45 mins

## 2022-02-01 NOTE — Progress Notes (Signed)
ANTICOAGULATION CONSULT NOTE -  Pharmacy Consult for heparin Indication: pulmonary embolus  No Known Allergies  Patient Measurements: Height: 5\' 10"  (177.8 cm) Weight: 104.7 kg (230 lb 13.2 oz) IBW/kg (Calculated) : 68.5 Heparin Dosing Weight: 90.6kh  Vital Signs: Temp: 98.3 F (36.8 C) (08/31 0741) Temp Source: Oral (08/31 0741) BP: 136/103 (08/31 1000) Pulse Rate: 102 (08/31 0900)  Labs: Recent Labs    01/29/22 1252 01/29/22 1445 01/29/22 1614 01/29/22 1627 01/29/22 1833 01/29/22 2226 01/30/22 0417 01/31/22 0716 01/31/22 1647 01/31/22 2305 02/01/22 0218 02/01/22 0435 02/01/22 0828  HGB 15.0  --    < >  --   --    < >  --  13.8 17.2* 14.4  --  14.3 12.0  HCT 44.7  --    < >  --   --    < >  --  40.5 52.8* 42.5  --  43.4 36.4  PLT 267  --   --   --   --    < >  --  271 227 175  --  184 146*  APTT  --  30  --   --   --   --   --   --   --   --   --   --   --   LABPROT  --  14.6  --   --   --   --   --   --   --   --   --   --   --   INR  --  1.1  --   --   --   --   --   --   --   --   --   --   --   HEPARINUNFRC  --   --   --   --   --    < > 0.46 0.45 0.45  --  0.36  --  0.23*  CREATININE 1.08*  --   --   --   --   --  1.02* 1.20*  --   --   --  1.07*  --   TROPONINIHS 23*  --   --  25* 26*  --   --   --   --   --   --   --   --    < > = values in this interval not displayed.     Estimated Creatinine Clearance: 90.7 mL/min (A) (by C-G formula based on SCr of 1.07 mg/dL (H)).  Assessment: 41 y.o. female who presented with bilateral PE. Pharmacy has been consulted for heparin. She was not on anticoagulation prior to admission.   Patient has been consistently therapeutic on current rate of 1600 units per hour, but experienced an episode of bleeding this morning. Heparin was stopped for about 30 minutes. Last heparin level was drawn shortly after infusion was resumed and was subtherapeutic at 0.23 as expected.   Heparin level historically at goal on 1600 units/hr,  CBC down from 14 to 12. Per Dr. 46 and IR team, okay to restart heparin.    Goal of Therapy:  Heparin level 0.3-0.7 units/ml Monitor platelets by anticoagulation protocol: Yes   Plan:  Continue heparin at current rate  Check heparin level in 6 hours  Continue daily heparin level and CBC Monitor CBC and s/sx of bleeding    Merrily Pew, PharmD  PGY1 Pharmacy Resident

## 2022-02-01 NOTE — Progress Notes (Signed)
Noted R. femoral sheath DRSG saturated in bright red blood. Removed DRSG and site bleeding, held pressure, CCM at bedside. Pt stated she felt weak and hot. Pt hypotension and bradycardia. Levo and fluid bolus started, heparin stopped. Radiology paged awaiting new orders.

## 2022-02-01 NOTE — Progress Notes (Signed)
Referring Physician(s): Dr. Lake Bells   Supervising Physician: Jacqulynn Cadet  Patient Status:  [facility] - In-pt  Chief Complaint: Pulmonary Embolism s/p catheter-directed thrombolysis 01/31/22   Subjective: Patient in bed resting comfortably. She is receiving oxygen via nasal cannula. Her significant other is in the room. She states she is breathing easier and does not have any pain/discomfort at the moment.   Allergies: Patient has no known allergies.  Medications: Prior to Admission medications   Medication Sig Start Date End Date Taking? Authorizing Provider  ibuprofen (ADVIL) 200 MG tablet Take 400 mg by mouth every 6 (six) hours as needed for mild pain.   Yes [provider]  JUNEL FE 1.5/30 1.5-30 MG-MCG tablet Take 1 tablet by mouth daily. 04/17/21  Yes [provider]  Multiple Vitamin (MULTIVITAMIN) capsule Take 1 capsule by mouth daily.   Yes [provider]  rizatriptan (MAXALT-MLT) 10 MG disintegrating tablet Take 1 tablet earliest onset of migraine.  May repeat in 2 hours if needed.  Maximum 2 tablets in 24 hours. 05/18/21  Yes Jaffe, Adam R, DO  topiramate (TOPAMAX) 50 MG tablet Take 1 tablet (50 mg total) by mouth at bedtime. Patient not taking: Reported on 01/30/2022 05/18/21   Pieter Partridge, DO     Vital Signs: BP (!) 132/98   Pulse (!) 117   Temp 97.7 F (36.5 C) (Oral)   Resp 20   Ht $R'5\' 10"'en$  (1.778 m)   Wt 230 lb 13.2 oz (104.7 kg)   LMP 01/04/2022 (Exact Date)   SpO2 96%   BMI 33.12 kg/m   Physical Exam Constitutional:      General: She is not in acute distress.    Appearance: She is not ill-appearing.  Cardiovascular:     Rate and Rhythm: Tachycardia present.     Comments: Right groin vascular site with pressure dressing. Site is clean, dry, soft and non-tender.  Pulmonary:     Effort: Pulmonary effort is normal.  Skin:    General: Skin is warm and dry.  Neurological:     Mental Status: She is alert and oriented to  person, place, and time.     Imaging: IR THROMB F/U EVAL ART/VEN FINAL DAY (MS)  Result Date: 02/01/2022 INDICATION: 41 year old female with a history of bilateral submassive PE now status post bilateral pulmonary thrombolysis. EXAM: IR THROMB F/U EVAL ART/VEN FINAL DAY COMPARISON:  Initiation of bilateral pulmonary arterial thrombolysis yesterday; CT a chest 01/29/2022 MEDICATIONS: None. ANESTHESIA/SEDATION: None. FLUOROSCOPY TIME:  None. COMPLICATIONS: None immediate. TECHNIQUE: Informed written consent was obtained from the patient after a thorough discussion of the procedural risks, benefits and alternatives. All questions were addressed. Maximal Sterile Barrier Technique was utilized including caps, mask, sterile gowns, sterile gloves, sterile drape, hand hygiene and skin antiseptic. A timeout was performed prior to the initiation of the procedure. The lysis catheters were pulled back into the main pulmonary artery. Pulmonary arterial pressures were then measured. The catheters were removed. Two 6 French sheaths were removed hemostasis was attained by manual pressure. FINDINGS: Main pulmonary arterial pressure measured at 54 and 55 mm Hg consistent with persistent arterial hypertension. IMPRESSION: Successful completion of bilateral pulmonary arterial thrombolysis. Electronically Signed   By: Jacqulynn Cadet M.D.   On: 02/01/2022 11:31   DG Chest Port 1 View  Result Date: 02/01/2022 CLINICAL DATA:  Pulmonary thromboembolism EXAM: PORTABLE CHEST 1 VIEW COMPARISON:  Chest x-ray dated January 29, 2022 FINDINGS: Cardiac and mediastinal contours are within normal limits.  Interval decreased right upper lobe opacity. New right lower lobe opacity. Inferior approach catheters project over the expected area of the proximal left lower lobe artery and distal right lower lobe artery. No large pleural effusion or pneumothorax. IMPRESSION: 1. Inferior approach catheters project over the expected area of the  proximal left lower lobe artery and a distal right lower lobe artery. 2. 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 Electronically Signed   By: Yetta Glassman M.D.   On: 02/01/2022 08:15   IR INFUSION THROMBOL VENOUS INITIAL (MS)  Result Date: 01/31/2022 INDICATION: Sub massive pulmonary embolism, not improving with conservative management. Given the peripheral predominant segmental and subsegmental bilateral pulmonary embolism demonstrated on chest CTA performed 01/29/2022, request made for initiation catheter directed pulmonary arterial thrombolysis. EXAM: 1. ULTRASOUND GUIDANCE FOR VENOUS ACCESS X2 2. PULMONARY ARTERIOGRAPHY 3. FLUOROSCOPIC GUIDED PLACEMENT OF BILATERAL PULMONARY ARTERIAL LYTIC INFUSION CATHETERS COMPARISON:  Chest CTA-01/29/2022 MEDICATIONS: None ANESTHESIA/SEDATION: Moderate (conscious) sedation was employed during this procedure as administered by the Interventional Radiology RN. A total of Versed 0.5 mg and Fentanyl 50 mcg was administered intravenously. Moderate Sedation Time: 40 minutes. The patient's level of consciousness and vital signs were monitored continuously by radiology nursing throughout the procedure under my direct supervision. CONTRAST:  45mL OMNIPAQUE IOHEXOL 300 MG/ML  SOLN FLUOROSCOPY TIME:  13 minutes, 12 seconds (270 mGy) COMPLICATIONS: None immediate. TECHNIQUE: Informed written consent was obtained from the patient after a discussion of the risks, benefits and alternatives to treatment. Questions regarding the procedure were encouraged and answered. A timeout was performed prior to the initiation of the procedure. Ultrasound scanning was performed of the right groin and demonstrated wide patency of the right common femoral vein. As such, the right common femoral vein was selected venous access. The right groin was prepped and draped in the usual sterile fashion, and a sterile drape was applied  covering the operative field. Maximum barrier sterile technique with sterile gowns and gloves were used for the procedure. A timeout was performed prior to the initiation of the procedure. Local anesthesia was provided with 1% lidocaine. Under direct ultrasound guidance, the right common femoral vein was accessed with a micro puncture kit ultimately allowing placement of a 6 French vascular sheath. Slightly cranial to this initial access, the right common femoral was again accessed with an additional micropuncture kit ultimately allowing placement of an additional 6 French vascular sheath. Ultrasound images were saved for procedural documentation purposes. With the use of a glidewire, a vertebral catheter was advanced into the main pulmonary artery and a limited central pulmonary arteriogram was performed. Pressure measurements were then obtained from the main pulmonary artery. The vertebral catheter was advanced into the distal branch of the left lower lobe pulmonary artery. Limited contrast injection confirmed appropriate positioning. Over an exchange length stiff glidewire, the vertebral catheter was exchanged for a 90/10 cm multi side-hole infusion catheter. With the use of a stiff glidewire, a vertebral catheter was advanced into a distal branch of the right lower lobe pulmonary artery. Limited contrast injection confirmed appropriate positioning. Over an exchange length stiff glidewire, the catheter was exchanged for a 90/15 cm multi side-hole infusion catheter. A postprocedural fluoroscopic image was obtained to document final catheter positioning. The external catheter tubing was secured at the right thigh and the lytic therapy was initiated. The patient tolerated the procedure well without immediate postprocedural complication. FINDINGS: Central pulmonary arteriogram confirms appropriate positioning for pulmonary arterial pressure measurements.  Acquired pressure measurements: Main  pulmonary artery - 98/44;  mean - 66 (normal: < 25/10) (Note, I feel the pulmonary arterial pressure measurements may be artifactually elevated due to being sampled with the patient positioned with head elevated approximately 25 degrees due to respiratory distress). Following the procedure, both ultrasound assisted infusion catheter tips terminate within the distal aspects of the bilateral lower lobe sub segmental pulmonary arteries. IMPRESSION: 1. Successful fluoroscopic guided initiation of bilateral ultrasound assisted catheter directed pulmonary arterial lysis for sub massive pulmonary embolism and right-sided heart strain. 2. Elevated pressure measurements within the main pulmonary artery compatible with critical pulmonary arterial hypertension,, though note, acquired pulmonary arterial pressure measurements may be artifactually elevated due to sampling with the patient positioned with the head of bed elevated approximately 25 degrees due to respiratory distress. PLAN: Initiate 12 hour tPA infusion per pulmonary arterial catheter lysis protocol with a.m. chest radiograph and plan for bedside pressure measurement tomorrow morning. Electronically Signed   By: Sandi Mariscal M.D.   On: 01/31/2022 15:50   IR US Guide Vasc Access Right  Result Date: 01/31/2022 INDICATION: Sub massive pulmonary embolism, not improving with conservative management. Given the peripheral predominant segmental and subsegmental bilateral pulmonary embolism demonstrated on chest CTA performed 01/29/2022, request made for initiation catheter directed pulmonary arterial thrombolysis. EXAM: 1. ULTRASOUND GUIDANCE FOR VENOUS ACCESS X2 2. PULMONARY ARTERIOGRAPHY 3. FLUOROSCOPIC GUIDED PLACEMENT OF BILATERAL PULMONARY ARTERIAL LYTIC INFUSION CATHETERS COMPARISON:  Chest CTA-01/29/2022 MEDICATIONS: None ANESTHESIA/SEDATION: Moderate (conscious) sedation was employed during this procedure as administered by the Interventional Radiology RN. A total of Versed 0.5 mg and  Fentanyl 50 mcg was administered intravenously. Moderate Sedation Time: 40 minutes. The patient's level of consciousness and vital signs were monitored continuously by radiology nursing throughout the procedure under my direct supervision. CONTRAST:  31mL OMNIPAQUE IOHEXOL 300 MG/ML  SOLN FLUOROSCOPY TIME:  13 minutes, 12 seconds (671 mGy) COMPLICATIONS: None immediate. TECHNIQUE: Informed written consent was obtained from the patient after a discussion of the risks, benefits and alternatives to treatment. Questions regarding the procedure were encouraged and answered. A timeout was performed prior to the initiation of the procedure. Ultrasound scanning was performed of the right groin and demonstrated wide patency of the right common femoral vein. As such, the right common femoral vein was selected venous access. The right groin was prepped and draped in the usual sterile fashion, and a sterile drape was applied covering the operative field. Maximum barrier sterile technique with sterile gowns and gloves were used for the procedure. A timeout was performed prior to the initiation of the procedure. Local anesthesia was provided with 1% lidocaine. Under direct ultrasound guidance, the right common femoral vein was accessed with a micro puncture kit ultimately allowing placement of a 6 French vascular sheath. Slightly cranial to this initial access, the right common femoral was again accessed with an additional micropuncture kit ultimately allowing placement of an additional 6 French vascular sheath. Ultrasound images were saved for procedural documentation purposes. With the use of a glidewire, a vertebral catheter was advanced into the main pulmonary artery and a limited central pulmonary arteriogram was performed. Pressure measurements were then obtained from the main pulmonary artery. The vertebral catheter was advanced into the distal branch of the left lower lobe pulmonary artery. Limited contrast injection  confirmed appropriate positioning. Over an exchange length stiff glidewire, the vertebral catheter was exchanged for a 90/10 cm multi side-hole infusion catheter. With the use of a stiff glidewire, a vertebral catheter was advanced  into a distal branch of the right lower lobe pulmonary artery. Limited contrast injection confirmed appropriate positioning. Over an exchange length stiff glidewire, the catheter was exchanged for a 90/15 cm multi side-hole infusion catheter. A postprocedural fluoroscopic image was obtained to document final catheter positioning. The external catheter tubing was secured at the right thigh and the lytic therapy was initiated. The patient tolerated the procedure well without immediate postprocedural complication. FINDINGS: Central pulmonary arteriogram confirms appropriate positioning for pulmonary arterial pressure measurements. Acquired pressure measurements: Main  pulmonary artery - 98/44; mean - 66 (normal: < 25/10) (Note, I feel the pulmonary arterial pressure measurements may be artifactually elevated due to being sampled with the patient positioned with head elevated approximately 25 degrees due to respiratory distress). Following the procedure, both ultrasound assisted infusion catheter tips terminate within the distal aspects of the bilateral lower lobe sub segmental pulmonary arteries. IMPRESSION: 1. Successful fluoroscopic guided initiation of bilateral ultrasound assisted catheter directed pulmonary arterial lysis for sub massive pulmonary embolism and right-sided heart strain. 2. Elevated pressure measurements within the main pulmonary artery compatible with critical pulmonary arterial hypertension,, though note, acquired pulmonary arterial pressure measurements may be artifactually elevated due to sampling with the patient positioned with the head of bed elevated approximately 25 degrees due to respiratory distress. PLAN: Initiate 12 hour tPA infusion per pulmonary arterial  catheter lysis protocol with a.m. chest radiograph and plan for bedside pressure measurement tomorrow morning. Electronically Signed   By: Sandi Mariscal M.D.   On: 01/31/2022 15:50   IR Angiogram Pulmonary Bilateral Selective  Result Date: 01/31/2022 INDICATION: Sub massive pulmonary embolism, not improving with conservative management. Given the peripheral predominant segmental and subsegmental bilateral pulmonary embolism demonstrated on chest CTA performed 01/29/2022, request made for initiation catheter directed pulmonary arterial thrombolysis. EXAM: 1. ULTRASOUND GUIDANCE FOR VENOUS ACCESS X2 2. PULMONARY ARTERIOGRAPHY 3. FLUOROSCOPIC GUIDED PLACEMENT OF BILATERAL PULMONARY ARTERIAL LYTIC INFUSION CATHETERS COMPARISON:  Chest CTA-01/29/2022 MEDICATIONS: None ANESTHESIA/SEDATION: Moderate (conscious) sedation was employed during this procedure as administered by the Interventional Radiology RN. A total of Versed 0.5 mg and Fentanyl 50 mcg was administered intravenously. Moderate Sedation Time: 40 minutes. The patient's level of consciousness and vital signs were monitored continuously by radiology nursing throughout the procedure under my direct supervision. CONTRAST:  78mL OMNIPAQUE IOHEXOL 300 MG/ML  SOLN FLUOROSCOPY TIME:  13 minutes, 12 seconds (144 mGy) COMPLICATIONS: None immediate. TECHNIQUE: Informed written consent was obtained from the patient after a discussion of the risks, benefits and alternatives to treatment. Questions regarding the procedure were encouraged and answered. A timeout was performed prior to the initiation of the procedure. Ultrasound scanning was performed of the right groin and demonstrated wide patency of the right common femoral vein. As such, the right common femoral vein was selected venous access. The right groin was prepped and draped in the usual sterile fashion, and a sterile drape was applied covering the operative field. Maximum barrier sterile technique with sterile  gowns and gloves were used for the procedure. A timeout was performed prior to the initiation of the procedure. Local anesthesia was provided with 1% lidocaine. Under direct ultrasound guidance, the right common femoral vein was accessed with a micro puncture kit ultimately allowing placement of a 6 French vascular sheath. Slightly cranial to this initial access, the right common femoral was again accessed with an additional micropuncture kit ultimately allowing placement of an additional 6 French vascular sheath. Ultrasound images were saved for procedural documentation purposes. With the use of a  glidewire, a vertebral catheter was advanced into the main pulmonary artery and a limited central pulmonary arteriogram was performed. Pressure measurements were then obtained from the main pulmonary artery. The vertebral catheter was advanced into the distal branch of the left lower lobe pulmonary artery. Limited contrast injection confirmed appropriate positioning. Over an exchange length stiff glidewire, the vertebral catheter was exchanged for a 90/10 cm multi side-hole infusion catheter. With the use of a stiff glidewire, a vertebral catheter was advanced into a distal branch of the right lower lobe pulmonary artery. Limited contrast injection confirmed appropriate positioning. Over an exchange length stiff glidewire, the catheter was exchanged for a 90/15 cm multi side-hole infusion catheter. A postprocedural fluoroscopic image was obtained to document final catheter positioning. The external catheter tubing was secured at the right thigh and the lytic therapy was initiated. The patient tolerated the procedure well without immediate postprocedural complication. FINDINGS: Central pulmonary arteriogram confirms appropriate positioning for pulmonary arterial pressure measurements. Acquired pressure measurements: Main  pulmonary artery - 98/44; mean - 66 (normal: < 25/10) (Note, I feel the pulmonary arterial pressure  measurements may be artifactually elevated due to being sampled with the patient positioned with head elevated approximately 25 degrees due to respiratory distress). Following the procedure, both ultrasound assisted infusion catheter tips terminate within the distal aspects of the bilateral lower lobe sub segmental pulmonary arteries. IMPRESSION: 1. Successful fluoroscopic guided initiation of bilateral ultrasound assisted catheter directed pulmonary arterial lysis for sub massive pulmonary embolism and right-sided heart strain. 2. Elevated pressure measurements within the main pulmonary artery compatible with critical pulmonary arterial hypertension,, though note, acquired pulmonary arterial pressure measurements may be artifactually elevated due to sampling with the patient positioned with the head of bed elevated approximately 25 degrees due to respiratory distress. PLAN: Initiate 12 hour tPA infusion per pulmonary arterial catheter lysis protocol with a.m. chest radiograph and plan for bedside pressure measurement tomorrow morning. Electronically Signed   By: Sandi Mariscal M.D.   On: 01/31/2022 15:50   IR INFUSION THROMBOL ARTERIAL INITIAL (MS)  Result Date: 01/31/2022 INDICATION: Sub massive pulmonary embolism, not improving with conservative management. Given the peripheral predominant segmental and subsegmental bilateral pulmonary embolism demonstrated on chest CTA performed 01/29/2022, request made for initiation catheter directed pulmonary arterial thrombolysis. EXAM: 1. ULTRASOUND GUIDANCE FOR VENOUS ACCESS X2 2. PULMONARY ARTERIOGRAPHY 3. FLUOROSCOPIC GUIDED PLACEMENT OF BILATERAL PULMONARY ARTERIAL LYTIC INFUSION CATHETERS COMPARISON:  Chest CTA-01/29/2022 MEDICATIONS: None ANESTHESIA/SEDATION: Moderate (conscious) sedation was employed during this procedure as administered by the Interventional Radiology RN. A total of Versed 0.5 mg and Fentanyl 50 mcg was administered intravenously. Moderate Sedation  Time: 40 minutes. The patient's level of consciousness and vital signs were monitored continuously by radiology nursing throughout the procedure under my direct supervision. CONTRAST:  58mL OMNIPAQUE IOHEXOL 300 MG/ML  SOLN FLUOROSCOPY TIME:  13 minutes, 12 seconds (323 mGy) COMPLICATIONS: None immediate. TECHNIQUE: Informed written consent was obtained from the patient after a discussion of the risks, benefits and alternatives to treatment. Questions regarding the procedure were encouraged and answered. A timeout was performed prior to the initiation of the procedure. Ultrasound scanning was performed of the right groin and demonstrated wide patency of the right common femoral vein. As such, the right common femoral vein was selected venous access. The right groin was prepped and draped in the usual sterile fashion, and a sterile drape was applied covering the operative field. Maximum barrier sterile technique with sterile gowns and gloves were used for the procedure. A  timeout was performed prior to the initiation of the procedure. Local anesthesia was provided with 1% lidocaine. Under direct ultrasound guidance, the right common femoral vein was accessed with a micro puncture kit ultimately allowing placement of a 6 French vascular sheath. Slightly cranial to this initial access, the right common femoral was again accessed with an additional micropuncture kit ultimately allowing placement of an additional 6 French vascular sheath. Ultrasound images were saved for procedural documentation purposes. With the use of a glidewire, a vertebral catheter was advanced into the main pulmonary artery and a limited central pulmonary arteriogram was performed. Pressure measurements were then obtained from the main pulmonary artery. The vertebral catheter was advanced into the distal branch of the left lower lobe pulmonary artery. Limited contrast injection confirmed appropriate positioning. Over an exchange length stiff  glidewire, the vertebral catheter was exchanged for a 90/10 cm multi side-hole infusion catheter. With the use of a stiff glidewire, a vertebral catheter was advanced into a distal branch of the right lower lobe pulmonary artery. Limited contrast injection confirmed appropriate positioning. Over an exchange length stiff glidewire, the catheter was exchanged for a 90/15 cm multi side-hole infusion catheter. A postprocedural fluoroscopic image was obtained to document final catheter positioning. The external catheter tubing was secured at the right thigh and the lytic therapy was initiated. The patient tolerated the procedure well without immediate postprocedural complication. FINDINGS: Central pulmonary arteriogram confirms appropriate positioning for pulmonary arterial pressure measurements. Acquired pressure measurements: Main  pulmonary artery - 98/44; mean - 66 (normal: < 25/10) (Note, I feel the pulmonary arterial pressure measurements may be artifactually elevated due to being sampled with the patient positioned with head elevated approximately 25 degrees due to respiratory distress). Following the procedure, both ultrasound assisted infusion catheter tips terminate within the distal aspects of the bilateral lower lobe sub segmental pulmonary arteries. IMPRESSION: 1. Successful fluoroscopic guided initiation of bilateral ultrasound assisted catheter directed pulmonary arterial lysis for sub massive pulmonary embolism and right-sided heart strain. 2. Elevated pressure measurements within the main pulmonary artery compatible with critical pulmonary arterial hypertension,, though note, acquired pulmonary arterial pressure measurements may be artifactually elevated due to sampling with the patient positioned with the head of bed elevated approximately 25 degrees due to respiratory distress. PLAN: Initiate 12 hour tPA infusion per pulmonary arterial catheter lysis protocol with a.m. chest radiograph and plan for  bedside pressure measurement tomorrow morning. Electronically Signed   By: Sandi Mariscal M.D.   On: 01/31/2022 15:50   IR Angiogram Selective Each Additional Vessel  Result Date: 01/31/2022 INDICATION: Sub massive pulmonary embolism, not improving with conservative management. Given the peripheral predominant segmental and subsegmental bilateral pulmonary embolism demonstrated on chest CTA performed 01/29/2022, request made for initiation catheter directed pulmonary arterial thrombolysis. EXAM: 1. ULTRASOUND GUIDANCE FOR VENOUS ACCESS X2 2. PULMONARY ARTERIOGRAPHY 3. FLUOROSCOPIC GUIDED PLACEMENT OF BILATERAL PULMONARY ARTERIAL LYTIC INFUSION CATHETERS COMPARISON:  Chest CTA-01/29/2022 MEDICATIONS: None ANESTHESIA/SEDATION: Moderate (conscious) sedation was employed during this procedure as administered by the Interventional Radiology RN. A total of Versed 0.5 mg and Fentanyl 50 mcg was administered intravenously. Moderate Sedation Time: 40 minutes. The patient's level of consciousness and vital signs were monitored continuously by radiology nursing throughout the procedure under my direct supervision. CONTRAST:  72mL OMNIPAQUE IOHEXOL 300 MG/ML  SOLN FLUOROSCOPY TIME:  13 minutes, 12 seconds (462 mGy) COMPLICATIONS: None immediate. TECHNIQUE: Informed written consent was obtained from the patient after a discussion of the risks, benefits and alternatives to treatment. Questions regarding  the procedure were encouraged and answered. A timeout was performed prior to the initiation of the procedure. Ultrasound scanning was performed of the right groin and demonstrated wide patency of the right common femoral vein. As such, the right common femoral vein was selected venous access. The right groin was prepped and draped in the usual sterile fashion, and a sterile drape was applied covering the operative field. Maximum barrier sterile technique with sterile gowns and gloves were used for the procedure. A timeout was  performed prior to the initiation of the procedure. Local anesthesia was provided with 1% lidocaine. Under direct ultrasound guidance, the right common femoral vein was accessed with a micro puncture kit ultimately allowing placement of a 6 French vascular sheath. Slightly cranial to this initial access, the right common femoral was again accessed with an additional micropuncture kit ultimately allowing placement of an additional 6 French vascular sheath. Ultrasound images were saved for procedural documentation purposes. With the use of a glidewire, a vertebral catheter was advanced into the main pulmonary artery and a limited central pulmonary arteriogram was performed. Pressure measurements were then obtained from the main pulmonary artery. The vertebral catheter was advanced into the distal branch of the left lower lobe pulmonary artery. Limited contrast injection confirmed appropriate positioning. Over an exchange length stiff glidewire, the vertebral catheter was exchanged for a 90/10 cm multi side-hole infusion catheter. With the use of a stiff glidewire, a vertebral catheter was advanced into a distal branch of the right lower lobe pulmonary artery. Limited contrast injection confirmed appropriate positioning. Over an exchange length stiff glidewire, the catheter was exchanged for a 90/15 cm multi side-hole infusion catheter. A postprocedural fluoroscopic image was obtained to document final catheter positioning. The external catheter tubing was secured at the right thigh and the lytic therapy was initiated. The patient tolerated the procedure well without immediate postprocedural complication. FINDINGS: Central pulmonary arteriogram confirms appropriate positioning for pulmonary arterial pressure measurements. Acquired pressure measurements: Main  pulmonary artery - 98/44; mean - 66 (normal: < 25/10) (Note, I feel the pulmonary arterial pressure measurements may be artifactually elevated due to being  sampled with the patient positioned with head elevated approximately 25 degrees due to respiratory distress). Following the procedure, both ultrasound assisted infusion catheter tips terminate within the distal aspects of the bilateral lower lobe sub segmental pulmonary arteries. IMPRESSION: 1. Successful fluoroscopic guided initiation of bilateral ultrasound assisted catheter directed pulmonary arterial lysis for sub massive pulmonary embolism and right-sided heart strain. 2. Elevated pressure measurements within the main pulmonary artery compatible with critical pulmonary arterial hypertension,, though note, acquired pulmonary arterial pressure measurements may be artifactually elevated due to sampling with the patient positioned with the head of bed elevated approximately 25 degrees due to respiratory distress. PLAN: Initiate 12 hour tPA infusion per pulmonary arterial catheter lysis protocol with a.m. chest radiograph and plan for bedside pressure measurement tomorrow morning. Electronically Signed   By: Sandi Mariscal M.D.   On: 01/31/2022 15:50   IR Angiogram Selective Each Additional Vessel  Result Date: 01/31/2022 INDICATION: Sub massive pulmonary embolism, not improving with conservative management. Given the peripheral predominant segmental and subsegmental bilateral pulmonary embolism demonstrated on chest CTA performed 01/29/2022, request made for initiation catheter directed pulmonary arterial thrombolysis. EXAM: 1. ULTRASOUND GUIDANCE FOR VENOUS ACCESS X2 2. PULMONARY ARTERIOGRAPHY 3. FLUOROSCOPIC GUIDED PLACEMENT OF BILATERAL PULMONARY ARTERIAL LYTIC INFUSION CATHETERS COMPARISON:  Chest CTA-01/29/2022 MEDICATIONS: None ANESTHESIA/SEDATION: Moderate (conscious) sedation was employed during this procedure as administered by the Interventional Radiology  RN. A total of Versed 0.5 mg and Fentanyl 50 mcg was administered intravenously. Moderate Sedation Time: 40 minutes. The patient's level of  consciousness and vital signs were monitored continuously by radiology nursing throughout the procedure under my direct supervision. CONTRAST:  28mL OMNIPAQUE IOHEXOL 300 MG/ML  SOLN FLUOROSCOPY TIME:  13 minutes, 12 seconds (347 mGy) COMPLICATIONS: None immediate. TECHNIQUE: Informed written consent was obtained from the patient after a discussion of the risks, benefits and alternatives to treatment. Questions regarding the procedure were encouraged and answered. A timeout was performed prior to the initiation of the procedure. Ultrasound scanning was performed of the right groin and demonstrated wide patency of the right common femoral vein. As such, the right common femoral vein was selected venous access. The right groin was prepped and draped in the usual sterile fashion, and a sterile drape was applied covering the operative field. Maximum barrier sterile technique with sterile gowns and gloves were used for the procedure. A timeout was performed prior to the initiation of the procedure. Local anesthesia was provided with 1% lidocaine. Under direct ultrasound guidance, the right common femoral vein was accessed with a micro puncture kit ultimately allowing placement of a 6 French vascular sheath. Slightly cranial to this initial access, the right common femoral was again accessed with an additional micropuncture kit ultimately allowing placement of an additional 6 French vascular sheath. Ultrasound images were saved for procedural documentation purposes. With the use of a glidewire, a vertebral catheter was advanced into the main pulmonary artery and a limited central pulmonary arteriogram was performed. Pressure measurements were then obtained from the main pulmonary artery. The vertebral catheter was advanced into the distal branch of the left lower lobe pulmonary artery. Limited contrast injection confirmed appropriate positioning. Over an exchange length stiff glidewire, the vertebral catheter was  exchanged for a 90/10 cm multi side-hole infusion catheter. With the use of a stiff glidewire, a vertebral catheter was advanced into a distal branch of the right lower lobe pulmonary artery. Limited contrast injection confirmed appropriate positioning. Over an exchange length stiff glidewire, the catheter was exchanged for a 90/15 cm multi side-hole infusion catheter. A postprocedural fluoroscopic image was obtained to document final catheter positioning. The external catheter tubing was secured at the right thigh and the lytic therapy was initiated. The patient tolerated the procedure well without immediate postprocedural complication. FINDINGS: Central pulmonary arteriogram confirms appropriate positioning for pulmonary arterial pressure measurements. Acquired pressure measurements: Main  pulmonary artery - 98/44; mean - 66 (normal: < 25/10) (Note, I feel the pulmonary arterial pressure measurements may be artifactually elevated due to being sampled with the patient positioned with head elevated approximately 25 degrees due to respiratory distress). Following the procedure, both ultrasound assisted infusion catheter tips terminate within the distal aspects of the bilateral lower lobe sub segmental pulmonary arteries. IMPRESSION: 1. Successful fluoroscopic guided initiation of bilateral ultrasound assisted catheter directed pulmonary arterial lysis for sub massive pulmonary embolism and right-sided heart strain. 2. Elevated pressure measurements within the main pulmonary artery compatible with critical pulmonary arterial hypertension,, though note, acquired pulmonary arterial pressure measurements may be artifactually elevated due to sampling with the patient positioned with the head of bed elevated approximately 25 degrees due to respiratory distress. PLAN: Initiate 12 hour tPA infusion per pulmonary arterial catheter lysis protocol with a.m. chest radiograph and plan for bedside pressure measurement tomorrow  morning. Electronically Signed   By: Sandi Mariscal M.D.   On: 01/31/2022 15:50   VAS [place] LOWER EXTREMITY  VENOUS (DVT)  Result Date: 01/30/2022  Lower Venous DVT Study Patient Name:  BRAIDEN PRESUTTI  Date of Exam:   01/30/2022 Medical Rec #: [id number]        Accession #:    [id number] Date of Birth: 05-Mar-1981         Patient Gender: F Patient Age:   41 years Exam Location:  [facility] Procedure:      VAS [place] LOWER EXTREMITY VENOUS (DVT) Referring Phys: Jerald Kief REGALADO --------------------------------------------------------------------------------  Indications: Pulmonary embolism.  Comparison Study: no prior Performing Technologist: Archie Patten RVS  Examination Guidelines: A complete evaluation includes B-mode imaging, spectral Doppler, color Doppler, and power Doppler as needed of all accessible portions of each vessel. Bilateral testing is considered an integral part of a complete examination. Limited examinations for reoccurring indications may be performed as noted. The reflux portion of the exam is performed with the patient in reverse Trendelenburg.  +---------+---------------+---------+-----------+----------+--------------+ RIGHT    CompressibilityPhasicitySpontaneityPropertiesThrombus Aging +---------+---------------+---------+-----------+----------+--------------+ CFV      Full           Yes      Yes                                 +---------+---------------+---------+-----------+----------+--------------+ SFJ      Full                                                        +---------+---------------+---------+-----------+----------+--------------+ FV Prox  Full                                                        +---------+---------------+---------+-----------+----------+--------------+ FV Mid   Full                                                        +---------+---------------+---------+-----------+----------+--------------+ FV DistalFull                                                         +---------+---------------+---------+-----------+----------+--------------+ PFV      Full                                                        +---------+---------------+---------+-----------+----------+--------------+ POP      Full           Yes      Yes                                 +---------+---------------+---------+-----------+----------+--------------+ PTV      Full                                                        +---------+---------------+---------+-----------+----------+--------------+  PERO     Full                                                        +---------+---------------+---------+-----------+----------+--------------+   +---------+---------------+---------+-----------+----------+--------------+ LEFT     CompressibilityPhasicitySpontaneityPropertiesThrombus Aging +---------+---------------+---------+-----------+----------+--------------+ CFV      Full           Yes      Yes                                 +---------+---------------+---------+-----------+----------+--------------+ SFJ      Full                                                        +---------+---------------+---------+-----------+----------+--------------+ FV Prox  Full                                                        +---------+---------------+---------+-----------+----------+--------------+ FV Mid   Full                                                        +---------+---------------+---------+-----------+----------+--------------+ FV DistalFull                                                        +---------+---------------+---------+-----------+----------+--------------+ PFV      Full                                                        +---------+---------------+---------+-----------+----------+--------------+ POP      Full           Yes      Yes                                  +---------+---------------+---------+-----------+----------+--------------+ PTV      Full                                                        +---------+---------------+---------+-----------+----------+--------------+ PERO     Full                                                        +---------+---------------+---------+-----------+----------+--------------+  Summary: BILATERAL: - No evidence of deep vein thrombosis seen in the lower extremities, bilaterally. -No evidence of popliteal cyst, bilaterally.   *See table(s) above for measurements and observations. Electronically signed by Deitra Mayo MD on 01/30/2022 at 4:54:07 PM.    Final    ECHOCARDIOGRAM COMPLETE  Result Date: 01/30/2022    ECHOCARDIOGRAM REPORT   Patient Name:   AYLIN RHOADS Date of Exam: 01/30/2022 Medical Rec #:  [id number]       Height:       70.0 in Accession #:    [id number]      Weight:       227.5 lb Date of Birth:  06/02/1981        BSA:          2.205 m Patient Age:    43 years        BP:           136/102 mmHg Patient Gender: F               HR:           99 bpm. Exam Location:  Inpatient Procedure: 2D Echo, Cardiac Doppler and Color Doppler Indications:    Pulmonary Embolus I26.09  History:        Patient has prior history of Echocardiogram examinations, most                 recent 05/12/2021.  Sonographer:    Eartha Inch Referring Phys: [id number] Arthur  Sonographer Comments: Suboptimal apical window and suboptimal subcostal window. Image acquisition challenging due to patient body habitus. IMPRESSIONS  1. Right ventricular systolic function is mildly reduced; RV McConnell's Sign. The right ventricular size is moderately enlarged. There is moderately elevated pulmonary artery systolic pressure. The estimated right ventricular systolic pressure is 07.3 mmHg.  2. Left ventricular ejection fraction, by estimation, is 65 to 70%. The left ventricle has normal function. The left ventricle has no  regional wall motion abnormalities. Left ventricular diastolic parameters were normal. There is the interventricular septum is flattened in diastole ('D' shaped left ventricle), consistent with right ventricular volume overload.  3. A small pericardial effusion is present. No evidence of respirophasic RV collapse, but there is tachycardia, RV changes as noted above, and IVC plethora  4. The mitral valve is normal in structure. No evidence of mitral valve regurgitation. No evidence of mitral stenosis.  5. The aortic valve is tricuspid. Aortic valve regurgitation is not visualized. No aortic stenosis is present. Comparison(s): Unable to see prior study. RV changes appears to be new, there is a small, apical pericardial effusion with abnormal RV function; there is evidence of RV strain. FINDINGS  Left Ventricle: Left ventricular ejection fraction, by estimation, is 65 to 70%. The left ventricle has normal function. The left ventricle has no regional wall motion abnormalities. The left ventricular internal cavity size was normal in size. There is  no left ventricular hypertrophy. The interventricular septum is flattened in diastole ('D' shaped left ventricle), consistent with right ventricular volume overload. Left ventricular diastolic parameters were normal. Right Ventricle: The right ventricular size is moderately enlarged. No increase in right ventricular wall thickness. Right ventricular systolic function is mildly reduced. There is moderately elevated pulmonary artery systolic pressure. The tricuspid regurgitant velocity is 3.49 m/s, and with an assumed right atrial pressure of 8 mmHg, the estimated right ventricular systolic pressure is 71.0 mmHg. Left Atrium: Left atrial size was normal in size. Right Atrium: Right atrial size  was normal in size. Pericardium: A small pericardial effusion is present. There is excessive respiratory variation in septal movement and diastolic collapse of the right ventricular free  wall. Mitral Valve: The mitral valve is normal in structure. No evidence of mitral valve regurgitation. No evidence of mitral valve stenosis. Tricuspid Valve: The tricuspid valve is normal in structure. Tricuspid valve regurgitation is mild . No evidence of tricuspid stenosis. Aortic Valve: The aortic valve is tricuspid. Aortic valve regurgitation is not visualized. No aortic stenosis is present. Pulmonic Valve: The pulmonic valve was grossly normal. Pulmonic valve regurgitation is trivial. No evidence of pulmonic stenosis. Aorta: The aortic root and ascending aorta are structurally normal, with no evidence of dilitation. IAS/Shunts: No atrial level shunt detected by color flow Doppler.  LEFT VENTRICLE PLAX 2D LVIDd:         4.40 cm   Diastology LVIDs:         2.70 cm   LV e' medial:  8.27 cm/s LV PW:         1.20 cm   LV e' lateral: 14.30 cm/s LV IVS:        1.00 cm LVOT diam:     2.00 cm LV SV:         36 LV SV Index:   16 LVOT Area:     3.14 cm  RIGHT VENTRICLE             IVC RV S prime:     11.40 cm/s  IVC diam: 2.20 cm TAPSE (M-mode): 1.6 cm LEFT ATRIUM             Index        RIGHT ATRIUM           Index LA diam:        2.40 cm 1.09 cm/m   RA Area:     13.40 cm LA Vol (A2C):   22.9 ml 10.39 ml/m  RA Volume:   35.50 ml  16.10 ml/m LA Vol (A4C):   9.6 ml  4.36 ml/m LA Biplane Vol: 14.6 ml 6.62 ml/m  AORTIC VALVE LVOT Vmax:   79.60 cm/s LVOT Vmean:  53.600 cm/s LVOT VTI:    0.115 m  AORTA Ao Root diam: 3.00 cm Ao Asc diam:  3.10 cm TRICUSPID VALVE TR Peak grad:   48.7 mmHg TR Vmax:        349.00 cm/s  SHUNTS Systemic VTI:  0.12 m Systemic Diam: 2.00 cm Rudean Haskell MD Electronically signed by Rudean Haskell MD Signature Date/Time: 01/30/2022/11:15:43 AM    Final    CT Angio Chest PE W and/or Wo Contrast  Result Date: 01/29/2022 CLINICAL DATA:  Shortness of breath and cough.  Chest pain. EXAM: CT ANGIOGRAPHY CHEST WITH CONTRAST TECHNIQUE: Multidetector CT imaging of the chest was  performed using the standard protocol during bolus administration of intravenous contrast. Multiplanar CT image reconstructions and MIPs were obtained to evaluate the vascular anatomy. RADIATION DOSE REDUCTION: This exam was performed according to the departmental dose-optimization program which includes automated exposure control, adjustment of the mA and/or kV according to patient size and/or use of iterative reconstruction technique. CONTRAST:  181mL OMNIPAQUE IOHEXOL 350 MG/ML SOLN COMPARISON:  Chest x-ray January 29, 2022 FINDINGS: Cardiovascular: The thoracic aorta is normal. The RV/LV ratio is 1.31. The heart is otherwise normal in appearance. No coronary artery disease identified on limited views. Significant bilateral pulmonary emboli are identified involving the distal left main pulmonary artery, extending into the upper  and lower lobe segmental branches. Embolus is also identified in the distal right main pulmonary embolus, extending into upper, middle, and lower lobe branches. Mediastinum/Nodes: No enlarged mediastinal, hilar, or axillary lymph nodes. Thyroid gland, trachea, and esophagus demonstrate no significant findings. Lungs/Pleura: Bilateral pulmonary infiltrates are identified, involving all lobes but most pronounced in the left upper lobe. There is dense consolidation in the periphery of the left upper lobe, extending to the left hilum, well appreciated on coronal image 47. There are air bronchograms within this dense consolidation which extends to the pleural surface. Elsewhere, there are scattered primarily ground-glass infiltrates located centrally and peripherally. Peripheral ground-glass is identified in the lateral right upper lobe on series 5, image 24. Evaluation for nodules or masses is limited due to the pulmonary infiltrates but no suspicious masses are identified. Upper Abdomen: No acute abnormality. Musculoskeletal: No chest wall abnormality. No acute or significant osseous findings.  Review of the MIP images confirms the above findings. IMPRESSION: 1. Extensive pulmonary emboli involving the distal aspects of both main pulmonary arteries and multiple segmental branches with an elevated RV/LV ratio of 1.31. Positive for acute PE with CT evidence of right heart strain (RV/LV Ratio = 1.31) consistent with at least submassive (intermediate risk) PE. The presence of right heart strain has been associated with an increased risk of morbidity and mortality. Please refer to the "Code PE Focused" order set in EPIC. 2. Bilateral pulmonary infiltrates. Overall, the pattern is that of infection with scattered ground-glass infiltrates. The more focal dense infiltrate in the periphery of the left upper lobe extending to the hilum could represent infection. However, in the setting of pulmonary emboli, developing infarct should be considered. Findings called to Dr. Regan Lemming. Electronically Signed   By: Dorise Bullion III M.D.   On: 01/29/2022 14:34   DG Chest 2 View  Result Date: 01/29/2022 CLINICAL DATA:  Shortness of breath and cough. EXAM: CHEST - 2 VIEW COMPARISON:  None Available. FINDINGS: Heart size and mediastinal contours are unremarkable. No pleural effusion identified. Left upper lobe airspace disease is identified concerning for pneumonia. Right lung appears clear. Visualized osseous structures are unremarkable. IMPRESSION: Left upper lobe airspace disease concerning for pneumonia. Followup PA and lateral chest X-ray is recommended in 3-4 weeks following trial of antibiotic therapy to ensure resolution and exclude underlying malignancy. Electronically Signed   By: Kerby Moors M.D.   On: 01/29/2022 11:49    Labs:  CBC: Recent Labs    01/31/22 1647 01/31/22 2305 02/01/22 0435 02/01/22 0828  WBC 11.1* 10.5 10.4 11.5*  HGB 17.2* 14.4 14.3 12.0  HCT 52.8* 42.5 43.4 36.4  PLT 227 175 184 146*    COAGS: Recent Labs    01/29/22 1445  INR 1.1  APTT 30    BMP: Recent Labs     01/29/22 1252 01/29/22 1614 01/30/22 0417 01/31/22 0716 02/01/22 0435  NA 136 137 136 138 137  K 3.9 4.0 4.2 4.5 4.4  CL 107  --  111 108 111  CO2 20*  --  19* 20* 19*  GLUCOSE 98  --  100* 93 103*  BUN 8  --  $R'6 7 7  'SK$ CALCIUM 8.6*  --  8.0* 8.5* 7.8*  CREATININE 1.08*  --  1.02* 1.20* 1.07*  GFRNONAA >60  --  >60 58* >60    LIVER FUNCTION TESTS: Recent Labs    01/30/22 0417 02/01/22 0435  BILITOT 0.5 0.5  AST 29 44*  ALT 42 63*  ALKPHOS  45 46  PROT 5.9* 5.8*  ALBUMIN 2.8* 2.6*    Assessment and Plan:  Pulmonary Embolism s/p catheter-directed thrombolysis 01/31/22   Patient experienced moderate/continuous bleeding from the right groin site all through the night requiring frequent dressing changes. This morning the patient had a vasovagal/presyncopal episode with bradycardia and hypotension. She required levophed for approximately 30 minutes.   Chest x-ray this morning showed: Interval decreased right upper lobe opacity and new right lower lobe opacity, likely evolving pulmonary infarcts.  Patient was seen this morning around 0830 at the bedside. Dressing clean/dry/intact during this assessment. After discussion with Critical Care team the decision was made to re-check a CBC prior to checking pressures/pulling catheters. Hgb was 12 (14.4 pre-procedure). Per Dr.Chand, ok to check pressures and remove catheters.   Pulmonary pressures: left 71/43; right 61/47. The catheters were removed at approximately 1000 today and the patient had a two hour bedrest. Patient re-assessed around 1330 today and she had not yet had a chance to get out of bed. She is breathing comfortably on nasal cannula. Heart rate still slightly elevated. She states she feels much better. Right groin vascular site with a pressure dressing in place. The site is clean, soft, dry and non-tender.   Patient currently on a heparin infusion. Potter for patient to get out of bed/ambulate. Ok to remove pressure dressing  tomorrow morning. Further plans per primary team/Critical care.   Please call IR with any questions/concerns.   Electronically Signed: Soyla Dryer, AGACNP-BC [phone] 02/01/2022, 1:23 PM   I spent a total of 15 Minutes at the the patient's bedside AND on the patient's hospital floor or unit, greater than 50% of which was counseling/coordinating care for post PE lysis care

## 2022-02-01 NOTE — Progress Notes (Addendum)
ANTICOAGULATION CONSULT NOTE -  Pharmacy Consult for heparin Indication: pulmonary embolus  No Known Allergies  Patient Measurements: Height: 5\' 10"  (177.8 cm) Weight: 104.7 kg (230 lb 13.2 oz) IBW/kg (Calculated) : 68.5 Heparin Dosing Weight: 90.6kh  Vital Signs: Temp: 97.7 F (36.5 C) (08/31 1158) Temp Source: Oral (08/31 1158) BP: 129/85 (08/31 1507) Pulse Rate: 112 (08/31 1507)  Labs: Recent Labs    01/29/22 1627 01/29/22 1833 01/29/22 2226 01/30/22 0417 01/31/22 0716 01/31/22 1647 02/01/22 0218 02/01/22 0435 02/01/22 0828 02/01/22 1445  HGB  --   --    < >  --  13.8   < >  --  14.3 12.0 13.5  HCT  --   --    < >  --  40.5   < >  --  43.4 36.4 40.1  PLT  --   --    < >  --  271   < >  --  184 146* 145*  HEPARINUNFRC  --   --    < > 0.46 0.45   < > 0.36  --  0.23* 0.47  CREATININE  --   --   --  1.02* 1.20*  --   --  1.07*  --   --   TROPONINIHS 25* 26*  --   --   --   --   --   --   --   --    < > = values in this interval not displayed.     Estimated Creatinine Clearance: 90.7 mL/min (A) (by C-G formula based on SCr of 1.07 mg/dL (H)).  Assessment: 41 y.o. female who presented with bilateral PE. Pharmacy has been consulted for heparin. She was not on anticoagulation prior to admission.   Patient has been consistently therapeutic on current rate of 1600 units per hour, but experienced an episode of bleeding this morning. Heparin was stopped for about 30 minutes. Last heparin level was drawn shortly after infusion was resumed and was subtherapeutic at 0.23 as expected.   Heparin level historically at goal on 1600 units/hr, CBC down from 14 to 12. Per Dr. 46 and IR team, okay to restart heparin.   PM update: Hl 0.47 on 1600 units/hr. Hgb 13.5, continue current rate, HHL to confirm in 6 hours  Goal of Therapy:  Heparin level 0.3-0.7 units/ml Monitor platelets by anticoagulation protocol: Yes   Plan:  Continue heparin at current rate of 1600  units/hr Check confirmatory heparin level in 6 hours  Continue daily heparin level and CBC Monitor CBC and s/sx of bleeding    Anja Neuzil A. Merrily Pew, PharmD, BCPS, FNKF Clinical Pharmacist [place] Please utilize Amion for appropriate phone number to reach the unit pharmacist [facility])  Addendum: confirmatory heparin level 0.57, still in range. Continue current rate.   Rudolph Dobler A. [facility], PharmD, BCPS, FNKF Clinical Pharmacist [place] Please utilize Amion for appropriate phone number to reach the unit pharmacist [facility])

## 2022-02-01 NOTE — Progress Notes (Signed)
Discussion w/Rachel; RAF u/s guided PIV still working.  An USGPIV (ultrasound guided PIV) has been placed for short-term vasopressor infusion. A correctly placed ivWatch must be used when administering Vasopressors. Should this treatment be needed beyond 72 hours, central line access should be obtained.  It will be the responsibility of the bedside nurse to follow best practice to prevent extravasations.

## 2022-02-02 DIAGNOSIS — I2699 Other pulmonary embolism without acute cor pulmonale: Secondary | ICD-10-CM | POA: Diagnosis not present

## 2022-02-02 DIAGNOSIS — J9601 Acute respiratory failure with hypoxia: Secondary | ICD-10-CM | POA: Diagnosis not present

## 2022-02-02 DIAGNOSIS — I2609 Other pulmonary embolism with acute cor pulmonale: Secondary | ICD-10-CM | POA: Insufficient documentation

## 2022-02-02 DIAGNOSIS — I2694 Multiple subsegmental pulmonary emboli without acute cor pulmonale: Secondary | ICD-10-CM | POA: Diagnosis not present

## 2022-02-02 LAB — CBC
HCT: 32.5 % — ABNORMAL LOW (ref 36.0–46.0)
Hemoglobin: 10.8 g/dL — ABNORMAL LOW (ref 12.0–15.0)
MCH: 30.3 pg (ref 26.0–34.0)
MCHC: 33.2 g/dL (ref 30.0–36.0)
MCV: 91 fL (ref 80.0–100.0)
Platelets: 132 10*3/uL — ABNORMAL LOW (ref 150–400)
RBC: 3.57 MIL/uL — ABNORMAL LOW (ref 3.87–5.11)
RDW: 13.2 % (ref 11.5–15.5)
WBC: 11.2 10*3/uL — ABNORMAL HIGH (ref 4.0–10.5)
nRBC: 0 % (ref 0.0–0.2)

## 2022-02-02 LAB — BASIC METABOLIC PANEL
Anion gap: 6 (ref 5–15)
BUN: 6 mg/dL (ref 6–20)
CO2: 21 mmol/L — ABNORMAL LOW (ref 22–32)
Calcium: 7.6 mg/dL — ABNORMAL LOW (ref 8.9–10.3)
Chloride: 112 mmol/L — ABNORMAL HIGH (ref 98–111)
Creatinine, Ser: 0.93 mg/dL (ref 0.44–1.00)
GFR, Estimated: >60 mL/min (ref >60)
Glucose, Bld: 104 mg/dL — ABNORMAL HIGH (ref 70–99)
Potassium: 3.5 mmol/L (ref 3.5–5.1)
Sodium: 139 mmol/L (ref 135–145)

## 2022-02-02 LAB — HEMOGLOBIN AND HEMATOCRIT, BLOOD
HCT: 29.6 % — ABNORMAL LOW (ref 36.0–46.0)
Hemoglobin: 10.1 g/dL — ABNORMAL LOW (ref 12.0–15.0)

## 2022-02-02 LAB — HEPARIN LEVEL (UNFRACTIONATED): Heparin Unfractionated: 0.46 IU/mL (ref 0.30–0.70)

## 2022-02-02 LAB — PROCALCITONIN: Procalcitonin: <0.1 ng/mL

## 2022-02-02 MED ORDER — POTASSIUM CHLORIDE CRYS ER 20 MEQ PO TBCR
40.0000 meq | EXTENDED_RELEASE_TABLET | Freq: Once | ORAL | Status: AC
  Administered 2022-02-02: 40 meq via ORAL
  Filled 2022-02-02: qty 2

## 2022-02-02 NOTE — TOC Progression Note (Signed)
Transition of Care [facility]) - Progression Note    Patient Details  Name: Norma Schmidt MRN: [id number] Date of Birth: [birth year]  Transition of Care (TOC) CM/SW Contact  Beckie Busing, RN Phone Number:[phone]  02/02/2022, 2:39 PM  Clinical Narrative:     Transition of Care (TOC) Screening Note   Patient Details  Name: [name] Date of Birth: 01-10-1981   Transition of Care [facility]) CM/SW Contact:    Beckie Busing, RN Phone Number: 02/02/2022, 2:40 PM    Transition of Care Department [facility]) has reviewed patient and no TOC needs have been identified at this time. We will continue to monitor patient advancement through interdisciplinary progression rounds.           Expected Discharge Plan and Services                                                 Social Determinants of Health (SDOH) Interventions    Readmission Risk Interventions     No data to display

## 2022-02-02 NOTE — Progress Notes (Signed)
   02/02/22 1530  Assess: MEWS Score  Temp (!) 97.5 F (36.4 C)  BP 123/71  MAP (mmHg) 86  Pulse Rate (!) 115  Resp 16  Level of Consciousness Alert  SpO2 93 %  O2 Device Room Air  Patient Activity (if Appropriate) In bed  Assess: MEWS Score  MEWS Temp 0  MEWS Systolic 0  MEWS Pulse 2  MEWS RR 0  MEWS LOC 0  MEWS Score 2  MEWS Score Color Yellow  Assess: if the MEWS score is Yellow or Red  Were vital signs taken at a resting state? Yes  Focused Assessment No change from prior assessment  Does the patient meet 2 or more of the SIRS criteria? No  MEWS guidelines implemented *See Row Information* Yes  Treat  MEWS Interventions Escalated (See documentation below)  Pain Scale 0-10  Pain Score 0  Take Vital Signs  Increase Vital Sign Frequency  Yellow: Q 2hr X 2 then Q 4hr X 2, if remains yellow, continue Q 4hrs  Escalate  MEWS: Escalate Yellow: discuss with charge nurse/RN and consider discussing with provider and RRT  Notify: Charge Nurse/RN  Name of Charge Nurse/RN Notified Sarah, RN  Date Charge Nurse/RN Notified 02/02/22  Time Charge Nurse/RN Notified 1540  Assess: SIRS CRITERIA  SIRS Temperature  0  SIRS Pulse 1  SIRS Respirations  0  SIRS WBC 1  SIRS Score Sum  2

## 2022-02-02 NOTE — Progress Notes (Signed)
[facility] ADULT ICU REPLACEMENT PROTOCOL   The patient does apply for the [facility] Adult ICU Electrolyte Replacment Protocol based on the criteria listed below:   1.Exclusion criteria: TCTS patients, ECMO patients, and Dialysis patients 2. Is GFR >/= 30 ml/min? Yes.    Patient's GFR today is >60 3. Is SCr </= 2? Yes.   Patient's SCr is 0.93 mg/dL 4. Did SCr increase >/= 0.5 in 24 hours? No. 5.Pt's weight >40kg  Yes.   6. Abnormal electrolyte(s): K 3.5  7. Electrolytes replaced per protocol 8.  Call MD STAT for K+ </= 2.5, Phos </= 1, or Mag </= 1 Physician:    Markus Daft A 02/02/2022 3:03 AM

## 2022-02-02 NOTE — Progress Notes (Addendum)
ANTICOAGULATION CONSULT NOTE Pharmacy Consult for heparin Indication: pulmonary embolus  No Known Allergies  Patient Measurements: Height: 5\' 10"  (177.8 cm) Weight: 104.7 kg (230 lb 13.2 oz) IBW/kg (Calculated) : 68.5 Heparin Dosing Weight: 90.6kh  Vital Signs: Temp: 98.2 F (36.8 C) (09/01 0745) Temp Source: Oral (09/01 0745) BP: 136/92 (09/01 0800) Pulse Rate: 99 (09/01 0800)  Labs: Recent Labs    01/31/22 0716 01/31/22 1647 02/01/22 0435 02/01/22 0828 02/01/22 1445 02/01/22 2110 02/02/22 0021 02/02/22 0607  HGB 13.8   < > 14.3 12.0 13.5  --  10.8* 10.1*  HCT 40.5   < > 43.4 36.4 40.1  --  32.5* 29.6*  PLT 271   < > 184 146* 145*  --  132*  --   HEPARINUNFRC 0.45   < >  --  0.23* 0.47 0.54  --  0.46  CREATININE 1.20*  --  1.07*  --   --   --  0.93  --    < > = values in this interval not displayed.     Estimated Creatinine Clearance: 104.3 mL/min (by C-G formula based on SCr of 0.93 mg/dL).  Assessment: 41 y.o. female who presented with bilateral PE. Pharmacy has been consulted for heparin. She was not on anticoagulation prior to admission.   Patient has been consistently therapeutic on current rate of 1600 units per hour, experienced an episode of bleeding from right fem site AM of 8/31. Bleeding resolved and heparin restarted after ~30 min.   Heparin level therapeutic this AM at 0.46, Hgb 10.1 slowly down trending from 13-14, PLT 132 down trending as well from 200s. No issues with infusion or bleeding per RN.  Goal of Therapy:  Heparin level 0.3-0.7 units/ml Monitor platelets by anticoagulation protocol: Yes   Plan:  Continue heparin at current rate of 1600 units/hr Continue daily heparin level  Monitor CBC and s/sx of bleeding  F/u long-term AC plans, transition to DOAC at discharge   9/31, PharmD Pharmacy Resident  02/02/2022 10:23 AM

## 2022-02-02 NOTE — Progress Notes (Signed)
NAME:  Norma Schmidt, MRN:  [id number], DOB:  1981-03-09, LOS: 4 ADMISSION DATE:  01/29/2022, CONSULTATION DATE:  8/29 REFERRING MD:  Sunnie Nielsen, CHIEF COMPLAINT:  dyspnea   History of Present Illness:  41 y/o female with minimal past medical history presented with 2 weeks of light headedness and dyspnea, found to have a submassive PE.  She says that she has not been immobilized or ill lately, takes oral contraceptive therapy.  She noticed feeling faint while climbing stairs 2 weeks ago and started to feel short of breath after.  This progressed over 2 weeks so she came to the ER where a CT angiogram was performed and found a submassive PE.  No cough or fever or hemoptysis.  No bleeding since starting on heparin.  Still has some shortness of breath with exertion.  No history of DVT in her family.   Pertinent  Medical History  Abnormal pap smear Uterine fibroid  Significant Hospital Events: Including procedures, antibiotic start and stop dates in addition to other pertinent events   8/28 CT angiogram chest > submassive PE with RV strain RV:LV 1.31 8/29 Echo LVEF 65-70%, RVSP 56.7 mmHg, RV moderately enlarged 8/30 bilateral catheter directed pulmonary arterial thrombolysis with IR 8/31 vasovagal episode: became hypotensive and bradycardic, improved with IVF and short course of levo 8/31 catheter removed   Interim History / Subjective:  Patient doing much better this morning. She slept well last night and states that her breathing is much improved. Denies any chest pain or further episodes of bleeding.   Objective   Blood pressure (!) 124/107, pulse (!) 108, temperature 98.1 F (36.7 C), temperature source Oral, resp. rate (!) 22, height 5\' 10"  (1.778 m), weight 104.7 kg, last menstrual period 01/04/2022, SpO2 95 %, currently breastfeeding.        Intake/Output Summary (Last 24 hours) at 02/02/2022 0637 Last data filed at 02/02/2022 0400 Gross per 24 hour  Intake 1980.65 ml  Output 900 ml   Net 1080.65 ml   Filed Weights   01/29/22 2042 01/31/22 0601 02/01/22 0500  Weight: 103.2 kg 103.2 kg 104.7 kg    Examination: General: resting in bed, no acute distress HENT: [place]/AT, eyes anicteric. Lungs: Normal work of breathing on [place], no wheezes or rhonchi Cardiovascular: Tachycardic rate, regular rhythm. No murmurs Abdomen: Soft, nontender, nondistended, +BS Extremities: Warm, dry. Catheter site bandaged.  Neuro: Alert and oriented x4, no focal deficit   Resolved Hospital Problem list     Assessment & Plan:  Provoked, submassive pulmonary embolism  Noted to have extensive bilateral PE on CTA with RV strain (submassive). Patient remained hemodynamically stable, however, on 8/30 her respiratory status was worsening and decision was made to pursue catheter directed thrombolysis. Patient underwent bilateral catheter directed pulmonary arterial thromboylsis with IR on 8/30 and catheter was removed 8/31.  - Continue heparin; level therapeutic this morning  - Hold oral contraceptives indefinitely - PT as able   Normocytic anemia Baseline Hb around 13-14, was 14.4 pre-procedure. Hb has been downtrending, most recently 10.8 on CBC secondary to acute blood loss. - Trend CBC - Transfuse for Hb<7 or symptomatic anemia   Mild thrombocytopenia Platelets dropped ~50% in the last 4 days. Down to 132 down (from 267 on admission). 4-T score with low risk of HIT. - Trend CBC  Acute respiratory failure with hypoxemia Down from 8 L  on 8/31 to 2 L today.  - Maintain O2 sat >92% - Wean  as able   LUL infiltrate 2/2  infarct Procalcitonin negative, no signs of PNA. LUL infiltrate is secondary to infarct from PE. Discontinued empiric abx 8/31.    Hypertension BP remains stable.  - PRN hydralazine for SBP>160, DBP>110  Best Practice (right click and "Reselect all SmartList Selections" daily)   Diet/type: Regular consistency (see orders) DVT prophylaxis: systemic heparin GI  prophylaxis: N/A Lines: N/A Foley:  N/A Code Status:  full code Last date of multidisciplinary goals of care discussion [updated patient and boyfriend at bedside 8/31, continue to do so daily]  Dispo: Transfer out of icu  Labs   CBC: Recent Labs  Lab 01/29/22 1252 01/29/22 1614 01/31/22 2305 02/01/22 0435 02/01/22 0828 02/01/22 1445 02/02/22 0021 02/02/22 0607  WBC 11.2*   < > 10.5 10.4 11.5* 11.0* 11.2*  --   NEUTROABS 7.8*  --   --   --   --   --   --   --   HGB 15.0   < > 14.4 14.3 12.0 13.5 10.8* 10.1*  HCT 44.7   < > 42.5 43.4 36.4 40.1 32.5* 29.6*  MCV 89.8   < > 90.2 90.8 91.5 90.3 91.0  --   PLT 267   < > 175 184 146* 145* 132*  --    < > = values in this interval not displayed.    Basic Metabolic Panel: Recent Labs  Lab 01/29/22 1252 01/29/22 1614 01/29/22 2226 01/30/22 0417 01/31/22 0716 02/01/22 0435 02/02/22 0021  NA 136 137  --  136 138 137 139  K 3.9 4.0  --  4.2 4.5 4.4 3.5  CL 107  --   --  111 108 111 112*  CO2 20*  --   --  19* 20* 19* 21*  GLUCOSE 98  --   --  100* 93 103* 104*  BUN 8  --   --  6 7 7 6   CREATININE 1.08*  --   --  1.02* 1.20* 1.07* 0.93  CALCIUM 8.6*  --   --  8.0* 8.5* 7.8* 7.6*  MG  --   --  2.1 2.0  --  2.0  --   PHOS  --   --   --  2.9  --   --   --    GFR: Estimated Creatinine Clearance: 104.3 mL/min (by C-G formula based on SCr of 0.93 mg/dL). Recent Labs  Lab 01/29/22 1445 01/29/22 2226 01/30/22 0246 02/01/22 0435 02/01/22 0828 02/01/22 1445 02/02/22 0021  PROCALCITON  --  <0.10  --  <0.10  --   --   --   WBC  --   --    < > 10.4 11.5* 11.0* 11.2*  LATICACIDVEN 1.1  --   --   --   --   --   --    < > = values in this interval not displayed.    Liver Function Tests: Recent Labs  Lab 01/30/22 0417 02/01/22 0435  AST 29 44*  ALT 42 63*  ALKPHOS 45 46  BILITOT 0.5 0.5  PROT 5.9* 5.8*  ALBUMIN 2.8* 2.6*   No results for input(s): "LIPASE", "AMYLASE" in the last 168 hours. No results for input(s):  "AMMONIA" in the last 168 hours.  ABG    Component Value Date/Time   PHART 7.440 01/29/2022 1614   PCO2ART 23.9 (L) 01/29/2022 1614   PO2ART 56 (L) 01/29/2022 1614   HCO3 16.3 (L) 01/29/2022 1614   TCO2 17 (L) 01/29/2022 1614   ACIDBASEDEF 6.0 (H) 01/29/2022  1614   O2SAT 91 01/29/2022 1614     Coagulation Profile: Recent Labs  Lab 01/29/22 1445  INR 1.1    Cardiac Enzymes: No results for input(s): "CKTOTAL", "CKMB", "CKMBINDEX", "TROPONINI" in the last 168 hours.  HbA1C: No results found for: "HGBA1C"  CBG: Recent Labs  Lab 01/31/22 1909  GLUCAP 72    Review of Systems:   As above  Past Medical History:  She,  has a past medical history of Abnormal Pap smear, Herpes simplex type 2 infection, History of chicken pox, History of uterine fibroid, and Positive ANA (antinuclear antibody).   Surgical History:   Past Surgical History:  Procedure Laterality Date   CERVICAL CERCLAGE  05/22/2011   Procedure: CERCLAGE CERVICAL;  Surgeon: Purcell Nails, MD;  Location: [facility];  Service: Gynecology;  Laterality: N/A;   COLPOSCOPY     DILATION AND CURETTAGE OF UTERUS     IR ANGIOGRAM PULMONARY BILATERAL SELECTIVE  01/31/2022   IR ANGIOGRAM SELECTIVE EACH ADDITIONAL VESSEL  01/31/2022   IR ANGIOGRAM SELECTIVE EACH ADDITIONAL VESSEL  01/31/2022   IR INFUSION THROMBOL ARTERIAL INITIAL (MS)  01/31/2022   IR INFUSION THROMBOL VENOUS INITIAL (MS)  01/31/2022   IR THROMB F/U EVAL ART/VEN FINAL DAY (MS)  02/01/2022   IR US GUIDE VASC ACCESS RIGHT  01/31/2022   WISDOM TOOTH EXTRACTION       Social History:   reports that she has never smoked. She has never used smokeless tobacco. She reports current alcohol use. She reports that she does not use drugs.   Family History:  Her family history includes Alcohol abuse in her father; Alzheimer's disease in her maternal grandmother; Arthritis in her maternal grandmother; Birth defects in her brother; Cancer in her father and paternal  grandmother; Diabetes in her maternal aunt; Heart disease in her mother; Hypertension in her maternal aunt.   Allergies No Known Allergies   Home Medications  Prior to Admission medications   Medication Sig Start Date End Date Taking? Authorizing Provider  ibuprofen (ADVIL) 200 MG tablet Take 400 mg by mouth every 6 (six) hours as needed for mild pain.   Yes [provider]  JUNEL FE 1.5/30 1.5-30 MG-MCG tablet Take 1 tablet by mouth daily. 04/17/21  Yes [provider]  Multiple Vitamin (MULTIVITAMIN) capsule Take 1 capsule by mouth daily.   Yes [provider]  rizatriptan (MAXALT-MLT) 10 MG disintegrating tablet Take 1 tablet earliest onset of migraine.  May repeat in 2 hours if needed.  Maximum 2 tablets in 24 hours. 05/18/21  Yes Jaffe, Adam R, DO  topiramate (TOPAMAX) 50 MG tablet Take 1 tablet (50 mg total) by mouth at bedtime. Patient not taking: Reported on 01/30/2022 05/18/21   Drema Dallas, DO     Critical care time: 20 mins

## 2022-02-02 NOTE — Evaluation (Signed)
Physical Therapy Evaluation Patient Details Name: Norma Schmidt MRN: [id number] DOB: [birth year] Today's Date: 02/02/2022  History of Present Illness  pt is a 41 y/o female admitted to [facility] 8/28 via transfer from [facility] with acute bilateral PE, s/p thrombolysis 8/30.   PMHx  migraines  Clinical Impression  Pt is at or close to baseline functioning and should be safe at home with available assist. There are no further acute PT needs.  Will sign off at this time.        Recommendations for follow up therapy are one component of a multi-disciplinary discharge planning process, led by the attending physician.  Recommendations may be updated based on patient status, additional functional criteria and insurance authorization.  Follow Up Recommendations No PT follow up      Assistance Recommended at Discharge PRN  Patient can return home with the following       Equipment Recommendations None recommended by PT  Recommendations for Other Services       Functional Status Assessment Patient has had a recent decline in their functional status and demonstrates the ability to make significant improvements in function in a reasonable and predictable amount of time.     Precautions / Restrictions Precautions Precautions: Fall      Mobility  Bed Mobility Overal bed mobility: Modified Independent                  Transfers Overall transfer level: Modified independent                      Ambulation/Gait Ambulation/Gait assistance: Independent Gait Distance (Feet): 300 Feet   Gait Pattern/deviations: Step-through pattern   Gait velocity interpretation: >2.62 ft/sec, indicative of community ambulatory   General Gait Details: steady, ability to attain age appropriate gait speeds, but with mild SOB,  HR rose into the upper 120's  Stairs Stairs: Yes Stairs assistance: Modified independent (Device/Increase time) Stair Management: One rail Right, Alternating  pattern, Forwards Number of Stairs: 4 General stair comments: safe with the rail  Wheelchair Mobility    Modified Rankin (Stroke Patients Only)       Balance Overall balance assessment: No apparent balance deficits (not formally assessed)                                           Pertinent Vitals/Pain Pain Assessment Pain Assessment: No/denies pain    Home Living Family/patient expects to be discharged to:: Private residence Living Arrangements: Children Available Help at Discharge: Available PRN/intermittently;Family Type of Home: House Home Access: Stairs to enter     Alternate Level Stairs-Number of Steps: flight Home Layout: Two level;Able to live on main level with bedroom/bathroom (dtr's bed/bath and office upstairs.) Home Equipment: None      Prior Function Prior Level of Function : Independent/Modified Independent;Working/employed;Driving                     Hand Dominance        Extremity/Trunk Assessment   Upper Extremity Assessment Upper Extremity Assessment: Overall WFL for tasks assessed    Lower Extremity Assessment Lower Extremity Assessment: Overall WFL for tasks assessed    Cervical / Trunk Assessment Cervical / Trunk Assessment: Normal  Communication   Communication: No difficulties  Cognition Arousal/Alertness: Awake/alert Behavior During Therapy: WFL for tasks assessed/performed Overall Cognitive Status: Within Functional Limits for  tasks assessed                                          General Comments General comments (skin integrity, edema, etc.): VSS except 10 pt rise in HR with activity to around 128 bpm    Exercises     Assessment/Plan    PT Assessment Patient does not need any further PT services  PT Problem List         PT Treatment Interventions      PT Goals (Current goals can be found in the Care Plan section)  Acute Rehab PT Goals PT Goal Formulation: All assessment  and education complete, DC therapy    Frequency       Co-evaluation               AM-PAC PT "6 Clicks" Mobility  Outcome Measure Help needed turning from your back to your side while in a flat bed without using bedrails?: None Help needed moving from lying on your back to sitting on the side of a flat bed without using bedrails?: None Help needed moving to and from a bed to a chair (including a wheelchair)?: None Help needed standing up from a chair using your arms (e.g., wheelchair or bedside chair)?: None Help needed to walk in hospital room?: None Help needed climbing 3-5 steps with a railing? : None 6 Click Score: 24    End of Session   Activity Tolerance: Patient tolerated treatment well Patient left: in chair;with call bell/phone within reach;with chair alarm set Nurse Communication: Mobility status PT Visit Diagnosis: Difficulty in walking, not elsewhere classified (R26.2)    Time: 0626-9485 PT Time Calculation (min) (ACUTE ONLY): 23 min   Charges:   PT Evaluation $PT Eval Low Complexity: 1 Low PT Treatments $Gait Training: 8-22 mins        02/02/2022  Jacinto Halim., PT Acute Rehabilitation Services [phone]  (pager) [phone]  (office)  Eliseo Gum Rashana Andrew 02/02/2022, 12:26 PM

## 2022-02-03 DIAGNOSIS — D62 Acute posthemorrhagic anemia: Secondary | ICD-10-CM

## 2022-02-03 DIAGNOSIS — R000 Tachycardia, unspecified: Secondary | ICD-10-CM | POA: Diagnosis not present

## 2022-02-03 DIAGNOSIS — I2699 Other pulmonary embolism without acute cor pulmonale: Secondary | ICD-10-CM | POA: Diagnosis not present

## 2022-02-03 DIAGNOSIS — I2609 Other pulmonary embolism with acute cor pulmonale: Secondary | ICD-10-CM | POA: Diagnosis not present

## 2022-02-03 DIAGNOSIS — E669 Obesity, unspecified: Secondary | ICD-10-CM

## 2022-02-03 LAB — CBC
HCT: 28.4 % — ABNORMAL LOW (ref 36.0–46.0)
Hemoglobin: 9.7 g/dL — ABNORMAL LOW (ref 12.0–15.0)
MCH: 30.4 pg (ref 26.0–34.0)
MCHC: 34.2 g/dL (ref 30.0–36.0)
MCV: 89 fL (ref 80.0–100.0)
Platelets: 151 10*3/uL (ref 150–400)
RBC: 3.19 MIL/uL — ABNORMAL LOW (ref 3.87–5.11)
RDW: 13.5 % (ref 11.5–15.5)
WBC: 9.3 10*3/uL (ref 4.0–10.5)
nRBC: 0 % (ref 0.0–0.2)

## 2022-02-03 LAB — BASIC METABOLIC PANEL
Anion gap: 11 (ref 5–15)
BUN: <5 mg/dL — ABNORMAL LOW (ref 6–20)
CO2: 19 mmol/L — ABNORMAL LOW (ref 22–32)
Calcium: 8.1 mg/dL — ABNORMAL LOW (ref 8.9–10.3)
Chloride: 110 mmol/L (ref 98–111)
Creatinine, Ser: 0.86 mg/dL (ref 0.44–1.00)
GFR, Estimated: >60 mL/min (ref >60)
Glucose, Bld: 95 mg/dL (ref 70–99)
Potassium: 4 mmol/L (ref 3.5–5.1)
Sodium: 140 mmol/L (ref 135–145)

## 2022-02-03 LAB — VITAMIN B12: Vitamin B-12: 949 pg/mL — ABNORMAL HIGH (ref 180–914)

## 2022-02-03 LAB — RETICULOCYTES
Immature Retic Fract: 30.2 % — ABNORMAL HIGH (ref 2.3–15.9)
RBC.: 3.19 MIL/uL — ABNORMAL LOW (ref 3.87–5.11)
Retic Count, Absolute: 124.7 10*3/uL (ref 19.0–186.0)
Retic Ct Pct: 3.9 % — ABNORMAL HIGH (ref 0.4–3.1)

## 2022-02-03 LAB — LUPUS ANTICOAGULANT PANEL
DRVVT: 43.8 s (ref 0.0–47.0)
PTT Lupus Anticoagulant: 39.3 s (ref 0.0–43.5)

## 2022-02-03 LAB — CULTURE, BLOOD (ROUTINE X 2)
Culture: NO GROWTH
Culture: NO GROWTH
Special Requests: ADEQUATE

## 2022-02-03 LAB — IRON AND TIBC
Iron: 38 ug/dL (ref 28–170)
Saturation Ratios: 12 % (ref 10.4–31.8)
TIBC: 315 ug/dL (ref 250–450)
UIBC: 277 ug/dL

## 2022-02-03 LAB — FOLATE: Folate: 6.9 ng/mL (ref >5.9)

## 2022-02-03 LAB — HEPARIN LEVEL (UNFRACTIONATED): Heparin Unfractionated: 0.45 IU/mL (ref 0.30–0.70)

## 2022-02-03 LAB — FERRITIN: Ferritin: 31 ng/mL (ref 11–307)

## 2022-02-03 MED ORDER — APIXABAN 5 MG PO TABS: 5.0000 mg | ORAL_TABLET | Freq: Two times a day (BID) | ORAL | 2 refills | Status: AC

## 2022-02-03 MED ORDER — APIXABAN 5 MG PO TABS: 5.0000 mg | ORAL_TABLET | Freq: Two times a day (BID) | ORAL | Status: DC

## 2022-02-03 MED ORDER — METOPROLOL TARTRATE 5 MG/5ML IV SOLN: 2.5000 mg | Freq: Four times a day (QID) | INTRAVENOUS | Status: DC | PRN

## 2022-02-03 MED ORDER — APIXABAN (ELIQUIS) VTE STARTER PACK (10MG AND 5MG): ORAL_TABLET | ORAL | 0 refills | Status: AC

## 2022-02-03 MED ORDER — APIXABAN 5 MG PO TABS
10.0000 mg | ORAL_TABLET | Freq: Two times a day (BID) | ORAL | Status: DC
  Administered 2022-02-03: 10 mg via ORAL
  Filled 2022-02-03: qty 2

## 2022-02-03 NOTE — Progress Notes (Signed)
ANTICOAGULATION CONSULT NOTE-Follow Up Pharmacy Consult for heparin Indication: pulmonary embolus  No Known Allergies  Patient Measurements: Height: 5\' 10"  (177.8 cm) Weight: 104.3 kg (230 lb) IBW/kg (Calculated) : 68.5 Heparin Dosing Weight: 90.6kh  Vital Signs: Temp: 97.9 F (36.6 C) (09/02 0733) Temp Source: Oral (09/02 0733) BP: 147/98 (09/02 0733) Pulse Rate: 114 (09/02 0733)  Labs: Recent Labs    02/01/22 0435 02/01/22 0828 02/01/22 1445 02/01/22 2110 02/02/22 0021 02/02/22 0607 02/03/22 0248  HGB 14.3   < > 13.5  --  10.8* 10.1* 9.7*  HCT 43.4   < > 40.1  --  32.5* 29.6* 28.4*  PLT 184   < > 145*  --  132*  --  151  HEPARINUNFRC  --    < > 0.47 0.54  --  0.46 0.45  CREATININE 1.07*  --   --   --  0.93  --  0.86   < > = values in this interval not displayed.     Estimated Creatinine Clearance: 112.5 mL/min (by C-G formula based on SCr of 0.86 mg/dL).  Assessment: 41 y.o. female who presented with bilateral PE. Pharmacy has been consulted for heparin. She was not on anticoagulation prior to admission.   Patient has been consistently therapeutic on current rate of 1600 units per hour, experienced an episode of bleeding from right fem site AM of 8/31. Bleeding resolved and heparin restarted after ~30 min.   Heparin level therapeutic this AM at 0.45, Hgb 9.7 slowly down trending from 13-14, PLT trending back up to 151. No issues with infusion or bleeding per the nurse.  Goal of Therapy:  Heparin level 0.3-0.7 units/ml Monitor platelets by anticoagulation protocol: Yes   Plan:  Continue heparin at current rate of 1600 units/hr Continue daily heparin level  Monitor CBC and s/sx of bleeding  F/u long-term AC plans, transition to DOAC at discharge   9/31, PharmD. [facility] Acute Care PGY-1 02/03/2022 9:24 AM

## 2022-02-03 NOTE — Discharge Summary (Signed)
Physician Discharge Summary  Norma Schmidt QBV:[id number] DOB: Apr 03, 1981 DOA: 01/29/2022  PCP: Virginia Rochester, PA  Admit date: 01/29/2022 Discharge date: 02/03/2022 Admitted From: Home Disposition:  Home Recommendations for Outpatient Follow-up:  Follow up with PCP in 1 to 2 weeks Check BMP and CBC at follow-up Please follow up on the following pending results: Factor V, prothrombin gene mutation, lupus anticoagulant  Home Health: Not indicated Equipment/Devices: Not indicated  Discharge Condition: Stable CODE STATUS: Full code  Follow-up Information     [facility], [place], [place]. Schedule an appointment as soon as possible for a visit in 1 week(s).   Specialty: Family Medicine Contact information: [address] [place] [id number] [facility] course 41 year old F with PMH of migraine headache presenting with 2 weeks of lightheadedness, SOB and DOE and admitted for acute respiratory failure with hypoxia due to submassive PE.  Patient is on oral contraceptive.  CTA chest showed extensive PE involving the distal aspect of both main pulmonary arteries and multiple segmental branches with an elevated RV/LV ratio 1.31 and bilateral pulmonary infarcts.  Patient was started on IV heparin.  However, current respiratory status deteriorated and she required up to 15 L by nasal cannula to maintain appropriate saturation.  As a result, patient was transferred to ICU and underwent bilateral catheter directed pulmonary arterial thromboylsis with IR on 8/30 and catheter was removed 8/31.  Respiratory status improved and eventually liberated off oxygen.  Pneumonia ruled out.  On the day of discharge, patient maintained saturation above 92% with ambulation on room air.  She was transitioned to p.o. Eliquis and discharged on the starter pack Eliquis.  Patient was advised to avoid OCP and NSAID.   See individual problem list below for more.   Problems  addressed during this hospitalization Principal Problem:   Pulmonary infarct [facility]) Active Problems:   Elevated troponin   History of migraine   Acute respiratory failure with hypoxia (HCC)   Acute pulmonary embolism with acute cor pulmonale (HCC)   Acute blood loss anemia   Sinus tachycardia              Vital signs Vitals:   02/03/22 0700 02/03/22 0733 02/03/22 1119 02/03/22 1218  BP:  (!) 147/98 (!) 139/92 (!) 148/90  Pulse:  (!) 114 (!) 110   Temp:  97.9 F (36.6 C) 97.8 F (36.6 C)   Resp:  16 18   Height:      Weight: 104.3 kg     SpO2:  94% 96%   TempSrc:  Oral Oral   BMI (Calculated): 33        Discharge exam  GENERAL: No apparent distress.  Nontoxic. HEENT: MMM.  Vision and hearing grossly intact.  NECK: Supple.  No apparent JVD.  RESP:  No IWOB.  Fair aeration bilaterally. CVS: Tachycardic to 110s.  Regular rhythm.  Heart sounds normal.  ABD/GI/GU: BS+. Abd soft, NTND.  MSK/EXT:  Moves extremities. No apparent deformity. No edema.  SKIN: no apparent skin lesion or wound NEURO: Awake and alert. Oriented appropriately.  No apparent focal neuro deficit. PSYCH: Calm. Normal affect.   Discharge Instructions Discharge Instructions     Call MD for:  difficulty breathing, headache or visual disturbances   Complete by: As directed    Call MD for:  extreme fatigue   Complete by: As directed    Call MD for:  persistant  dizziness or light-headedness   Complete by: As directed    Call MD for:  severe uncontrolled pain   Complete by: As directed    Diet general   Complete by: As directed    Discharge instructions   Complete by: As directed    It has been a pleasure taking care of you!  You were hospitalized due to pulmonary embolism/blood clot in your lung for which you have been treated interventionally and blood thinner.  The blood clot could have been provoked by your birth control.  We have stopped her birth control.  We are discharging you on Eliquis  (apixaban).  It is very important that you take this medication as prescribed.  While taking Eliquis, we strongly recommend you avoid any over-the-counter pain medication other than plain Tylenol.  Follow-up with your primary care doctor in 1 to 2 weeks or sooner if needed.   Take care,   Increase activity slowly   Complete by: As directed       Allergies as of 02/03/2022   No Known Allergies      Medication List     STOP taking these medications    ibuprofen 200 MG tablet Commonly known as: ADVIL   Junel FE 1.5/30 1.5-30 MG-MCG tablet Generic drug: norethindrone-ethinyl estradiol-iron       TAKE these medications    Apixaban Starter Pack ($RemoveBefor'10mg'UbgsCGxrDemD$  and $Re'5mg'iZG$ ) Commonly known as: ELIQUIS STARTER PACK Take as directed on package: start with two-$RemoveBefore'5mg'uxvKZUFsXwLXa$  tablets twice daily for 7 days. On day 8, switch to one-$RemoveBefor'5mg'aFlxvcUCreBp$  tablet twice daily.   apixaban 5 MG Tabs tablet Commonly known as: ELIQUIS Take 1 tablet (5 mg total) by mouth 2 (two) times daily. Start after you finish starter pack Start taking on: March 05, 2022   multivitamin capsule Take 1 capsule by mouth daily.   rizatriptan 10 MG disintegrating tablet Commonly known as: Maxalt-MLT Take 1 tablet earliest onset of migraine.  May repeat in 2 hours if needed.  Maximum 2 tablets in 24 hours.   topiramate 50 MG tablet Commonly known as: Topamax Take 1 tablet (50 mg total) by mouth at bedtime.        Consultations: Pulmonology Interventional radiology  Procedures/Studies: 8/30-catheter directed thrombolysis of bilateral pulmonary arteries by IR   IR THROMB F/U EVAL ART/VEN FINAL DAY (MS)  Result Date: 02/01/2022 INDICATION: 41 year old female with a history of bilateral submassive PE now status post bilateral pulmonary thrombolysis. EXAM: IR THROMB F/U EVAL ART/VEN FINAL DAY COMPARISON:  Initiation of bilateral pulmonary arterial thrombolysis yesterday; CT a chest 01/29/2022 MEDICATIONS: None. ANESTHESIA/SEDATION: None.  FLUOROSCOPY TIME:  None. COMPLICATIONS: None immediate. TECHNIQUE: Informed written consent was obtained from the patient after a thorough discussion of the procedural risks, benefits and alternatives. All questions were addressed. Maximal Sterile Barrier Technique was utilized including caps, mask, sterile gowns, sterile gloves, sterile drape, hand hygiene and skin antiseptic. A timeout was performed prior to the initiation of the procedure. The lysis catheters were pulled back into the main pulmonary artery. Pulmonary arterial pressures were then measured. The catheters were removed. Two 6 French sheaths were removed hemostasis was attained by manual pressure. FINDINGS: Main pulmonary arterial pressure measured at 54 and 55 mm Hg consistent with persistent arterial hypertension. IMPRESSION: Successful completion of bilateral pulmonary arterial thrombolysis. Electronically Signed   By: Jacqulynn Cadet M.D.   On: 02/01/2022 11:31   DG Chest Port 1 View  Result Date: 02/01/2022 CLINICAL DATA:  Pulmonary thromboembolism EXAM: PORTABLE CHEST 1 VIEW  COMPARISON:  Chest x-ray dated January 29, 2022 FINDINGS: Cardiac and mediastinal contours are within normal limits. Interval decreased right upper lobe opacity. New right lower lobe opacity. Inferior approach catheters project over the expected area of the proximal left lower lobe artery and distal right lower lobe artery. No large pleural effusion or pneumothorax. IMPRESSION: 1. Inferior approach catheters project over the expected area of the proximal left lower lobe artery and a distal right lower lobe artery. 2. 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 Electronically Signed   By: Yetta Glassman M.D.   On: 02/01/2022 08:15   IR INFUSION THROMBOL VENOUS INITIAL (MS)  Result Date: 01/31/2022 INDICATION: Sub massive pulmonary embolism, not improving with conservative management. Given the  peripheral predominant segmental and subsegmental bilateral pulmonary embolism demonstrated on chest CTA performed 01/29/2022, request made for initiation catheter directed pulmonary arterial thrombolysis. EXAM: 1. ULTRASOUND GUIDANCE FOR VENOUS ACCESS X2 2. PULMONARY ARTERIOGRAPHY 3. FLUOROSCOPIC GUIDED PLACEMENT OF BILATERAL PULMONARY ARTERIAL LYTIC INFUSION CATHETERS COMPARISON:  Chest CTA-01/29/2022 MEDICATIONS: None ANESTHESIA/SEDATION: Moderate (conscious) sedation was employed during this procedure as administered by the Interventional Radiology RN. A total of Versed 0.5 mg and Fentanyl 50 mcg was administered intravenously. Moderate Sedation Time: 40 minutes. The patient's level of consciousness and vital signs were monitored continuously by radiology nursing throughout the procedure under my direct supervision. CONTRAST:  35mL OMNIPAQUE IOHEXOL 300 MG/ML  SOLN FLUOROSCOPY TIME:  13 minutes, 12 seconds (245 mGy) COMPLICATIONS: None immediate. TECHNIQUE: Informed written consent was obtained from the patient after a discussion of the risks, benefits and alternatives to treatment. Questions regarding the procedure were encouraged and answered. A timeout was performed prior to the initiation of the procedure. Ultrasound scanning was performed of the right groin and demonstrated wide patency of the right common femoral vein. As such, the right common femoral vein was selected venous access. The right groin was prepped and draped in the usual sterile fashion, and a sterile drape was applied covering the operative field. Maximum barrier sterile technique with sterile gowns and gloves were used for the procedure. A timeout was performed prior to the initiation of the procedure. Local anesthesia was provided with 1% lidocaine. Under direct ultrasound guidance, the right common femoral vein was accessed with a micro puncture kit ultimately allowing placement of a 6 French vascular sheath. Slightly cranial to this  initial access, the right common femoral was again accessed with an additional micropuncture kit ultimately allowing placement of an additional 6 French vascular sheath. Ultrasound images were saved for procedural documentation purposes. With the use of a glidewire, a vertebral catheter was advanced into the main pulmonary artery and a limited central pulmonary arteriogram was performed. Pressure measurements were then obtained from the main pulmonary artery. The vertebral catheter was advanced into the distal branch of the left lower lobe pulmonary artery. Limited contrast injection confirmed appropriate positioning. Over an exchange length stiff glidewire, the vertebral catheter was exchanged for a 90/10 cm multi side-hole infusion catheter. With the use of a stiff glidewire, a vertebral catheter was advanced into a distal branch of the right lower lobe pulmonary artery. Limited contrast injection confirmed appropriate positioning. Over an exchange length stiff glidewire, the catheter was exchanged for a 90/15 cm multi side-hole infusion catheter. A postprocedural fluoroscopic image was obtained to document final catheter positioning. The external catheter tubing was secured at the right thigh and the lytic therapy was initiated. The patient tolerated the procedure  well without immediate postprocedural complication. FINDINGS: Central pulmonary arteriogram confirms appropriate positioning for pulmonary arterial pressure measurements. Acquired pressure measurements: Main  pulmonary artery - 98/44; mean - 66 (normal: < 25/10) (Note, I feel the pulmonary arterial pressure measurements may be artifactually elevated due to being sampled with the patient positioned with head elevated approximately 25 degrees due to respiratory distress). Following the procedure, both ultrasound assisted infusion catheter tips terminate within the distal aspects of the bilateral lower lobe sub segmental pulmonary arteries. IMPRESSION: 1.  Successful fluoroscopic guided initiation of bilateral ultrasound assisted catheter directed pulmonary arterial lysis for sub massive pulmonary embolism and right-sided heart strain. 2. Elevated pressure measurements within the main pulmonary artery compatible with critical pulmonary arterial hypertension,, though note, acquired pulmonary arterial pressure measurements may be artifactually elevated due to sampling with the patient positioned with the head of bed elevated approximately 25 degrees due to respiratory distress. PLAN: Initiate 12 hour tPA infusion per pulmonary arterial catheter lysis protocol with a.m. chest radiograph and plan for bedside pressure measurement tomorrow morning. Electronically Signed   By: Sandi Mariscal M.D.   On: 01/31/2022 15:50   IR US Guide Vasc Access Right  Result Date: 01/31/2022 INDICATION: Sub massive pulmonary embolism, not improving with conservative management. Given the peripheral predominant segmental and subsegmental bilateral pulmonary embolism demonstrated on chest CTA performed 01/29/2022, request made for initiation catheter directed pulmonary arterial thrombolysis. EXAM: 1. ULTRASOUND GUIDANCE FOR VENOUS ACCESS X2 2. PULMONARY ARTERIOGRAPHY 3. FLUOROSCOPIC GUIDED PLACEMENT OF BILATERAL PULMONARY ARTERIAL LYTIC INFUSION CATHETERS COMPARISON:  Chest CTA-01/29/2022 MEDICATIONS: None ANESTHESIA/SEDATION: Moderate (conscious) sedation was employed during this procedure as administered by the Interventional Radiology RN. A total of Versed 0.5 mg and Fentanyl 50 mcg was administered intravenously. Moderate Sedation Time: 40 minutes. The patient's level of consciousness and vital signs were monitored continuously by radiology nursing throughout the procedure under my direct supervision. CONTRAST:  77mL OMNIPAQUE IOHEXOL 300 MG/ML  SOLN FLUOROSCOPY TIME:  13 minutes, 12 seconds (607 mGy) COMPLICATIONS: None immediate. TECHNIQUE: Informed written consent was obtained from the  patient after a discussion of the risks, benefits and alternatives to treatment. Questions regarding the procedure were encouraged and answered. A timeout was performed prior to the initiation of the procedure. Ultrasound scanning was performed of the right groin and demonstrated wide patency of the right common femoral vein. As such, the right common femoral vein was selected venous access. The right groin was prepped and draped in the usual sterile fashion, and a sterile drape was applied covering the operative field. Maximum barrier sterile technique with sterile gowns and gloves were used for the procedure. A timeout was performed prior to the initiation of the procedure. Local anesthesia was provided with 1% lidocaine. Under direct ultrasound guidance, the right common femoral vein was accessed with a micro puncture kit ultimately allowing placement of a 6 French vascular sheath. Slightly cranial to this initial access, the right common femoral was again accessed with an additional micropuncture kit ultimately allowing placement of an additional 6 French vascular sheath. Ultrasound images were saved for procedural documentation purposes. With the use of a glidewire, a vertebral catheter was advanced into the main pulmonary artery and a limited central pulmonary arteriogram was performed. Pressure measurements were then obtained from the main pulmonary artery. The vertebral catheter was advanced into the distal branch of the left lower lobe pulmonary artery. Limited contrast injection confirmed appropriate positioning. Over an exchange length stiff glidewire, the vertebral catheter was exchanged for a 90/10 cm  multi side-hole infusion catheter. With the use of a stiff glidewire, a vertebral catheter was advanced into a distal branch of the right lower lobe pulmonary artery. Limited contrast injection confirmed appropriate positioning. Over an exchange length stiff glidewire, the catheter was exchanged for a  90/15 cm multi side-hole infusion catheter. A postprocedural fluoroscopic image was obtained to document final catheter positioning. The external catheter tubing was secured at the right thigh and the lytic therapy was initiated. The patient tolerated the procedure well without immediate postprocedural complication. FINDINGS: Central pulmonary arteriogram confirms appropriate positioning for pulmonary arterial pressure measurements. Acquired pressure measurements: Main  pulmonary artery - 98/44; mean - 66 (normal: < 25/10) (Note, I feel the pulmonary arterial pressure measurements may be artifactually elevated due to being sampled with the patient positioned with head elevated approximately 25 degrees due to respiratory distress). Following the procedure, both ultrasound assisted infusion catheter tips terminate within the distal aspects of the bilateral lower lobe sub segmental pulmonary arteries. IMPRESSION: 1. Successful fluoroscopic guided initiation of bilateral ultrasound assisted catheter directed pulmonary arterial lysis for sub massive pulmonary embolism and right-sided heart strain. 2. Elevated pressure measurements within the main pulmonary artery compatible with critical pulmonary arterial hypertension,, though note, acquired pulmonary arterial pressure measurements may be artifactually elevated due to sampling with the patient positioned with the head of bed elevated approximately 25 degrees due to respiratory distress. PLAN: Initiate 12 hour tPA infusion per pulmonary arterial catheter lysis protocol with a.m. chest radiograph and plan for bedside pressure measurement tomorrow morning. Electronically Signed   By: Sandi Mariscal M.D.   On: 01/31/2022 15:50   IR Angiogram Pulmonary Bilateral Selective  Result Date: 01/31/2022 INDICATION: Sub massive pulmonary embolism, not improving with conservative management. Given the peripheral predominant segmental and subsegmental bilateral pulmonary embolism  demonstrated on chest CTA performed 01/29/2022, request made for initiation catheter directed pulmonary arterial thrombolysis. EXAM: 1. ULTRASOUND GUIDANCE FOR VENOUS ACCESS X2 2. PULMONARY ARTERIOGRAPHY 3. FLUOROSCOPIC GUIDED PLACEMENT OF BILATERAL PULMONARY ARTERIAL LYTIC INFUSION CATHETERS COMPARISON:  Chest CTA-01/29/2022 MEDICATIONS: None ANESTHESIA/SEDATION: Moderate (conscious) sedation was employed during this procedure as administered by the Interventional Radiology RN. A total of Versed 0.5 mg and Fentanyl 50 mcg was administered intravenously. Moderate Sedation Time: 40 minutes. The patient's level of consciousness and vital signs were monitored continuously by radiology nursing throughout the procedure under my direct supervision. CONTRAST:  50mL OMNIPAQUE IOHEXOL 300 MG/ML  SOLN FLUOROSCOPY TIME:  13 minutes, 12 seconds (829 mGy) COMPLICATIONS: None immediate. TECHNIQUE: Informed written consent was obtained from the patient after a discussion of the risks, benefits and alternatives to treatment. Questions regarding the procedure were encouraged and answered. A timeout was performed prior to the initiation of the procedure. Ultrasound scanning was performed of the right groin and demonstrated wide patency of the right common femoral vein. As such, the right common femoral vein was selected venous access. The right groin was prepped and draped in the usual sterile fashion, and a sterile drape was applied covering the operative field. Maximum barrier sterile technique with sterile gowns and gloves were used for the procedure. A timeout was performed prior to the initiation of the procedure. Local anesthesia was provided with 1% lidocaine. Under direct ultrasound guidance, the right common femoral vein was accessed with a micro puncture kit ultimately allowing placement of a 6 French vascular sheath. Slightly cranial to this initial access, the right common femoral was again accessed with an additional  micropuncture kit ultimately allowing placement of an additional  6 French vascular sheath. Ultrasound images were saved for procedural documentation purposes. With the use of a glidewire, a vertebral catheter was advanced into the main pulmonary artery and a limited central pulmonary arteriogram was performed. Pressure measurements were then obtained from the main pulmonary artery. The vertebral catheter was advanced into the distal branch of the left lower lobe pulmonary artery. Limited contrast injection confirmed appropriate positioning. Over an exchange length stiff glidewire, the vertebral catheter was exchanged for a 90/10 cm multi side-hole infusion catheter. With the use of a stiff glidewire, a vertebral catheter was advanced into a distal branch of the right lower lobe pulmonary artery. Limited contrast injection confirmed appropriate positioning. Over an exchange length stiff glidewire, the catheter was exchanged for a 90/15 cm multi side-hole infusion catheter. A postprocedural fluoroscopic image was obtained to document final catheter positioning. The external catheter tubing was secured at the right thigh and the lytic therapy was initiated. The patient tolerated the procedure well without immediate postprocedural complication. FINDINGS: Central pulmonary arteriogram confirms appropriate positioning for pulmonary arterial pressure measurements. Acquired pressure measurements: Main  pulmonary artery - 98/44; mean - 66 (normal: < 25/10) (Note, I feel the pulmonary arterial pressure measurements may be artifactually elevated due to being sampled with the patient positioned with head elevated approximately 25 degrees due to respiratory distress). Following the procedure, both ultrasound assisted infusion catheter tips terminate within the distal aspects of the bilateral lower lobe sub segmental pulmonary arteries. IMPRESSION: 1. Successful fluoroscopic guided initiation of bilateral ultrasound assisted  catheter directed pulmonary arterial lysis for sub massive pulmonary embolism and right-sided heart strain. 2. Elevated pressure measurements within the main pulmonary artery compatible with critical pulmonary arterial hypertension,, though note, acquired pulmonary arterial pressure measurements may be artifactually elevated due to sampling with the patient positioned with the head of bed elevated approximately 25 degrees due to respiratory distress. PLAN: Initiate 12 hour tPA infusion per pulmonary arterial catheter lysis protocol with a.m. chest radiograph and plan for bedside pressure measurement tomorrow morning. Electronically Signed   By: Sandi Mariscal M.D.   On: 01/31/2022 15:50   IR INFUSION THROMBOL ARTERIAL INITIAL (MS)  Result Date: 01/31/2022 INDICATION: Sub massive pulmonary embolism, not improving with conservative management. Given the peripheral predominant segmental and subsegmental bilateral pulmonary embolism demonstrated on chest CTA performed 01/29/2022, request made for initiation catheter directed pulmonary arterial thrombolysis. EXAM: 1. ULTRASOUND GUIDANCE FOR VENOUS ACCESS X2 2. PULMONARY ARTERIOGRAPHY 3. FLUOROSCOPIC GUIDED PLACEMENT OF BILATERAL PULMONARY ARTERIAL LYTIC INFUSION CATHETERS COMPARISON:  Chest CTA-01/29/2022 MEDICATIONS: None ANESTHESIA/SEDATION: Moderate (conscious) sedation was employed during this procedure as administered by the Interventional Radiology RN. A total of Versed 0.5 mg and Fentanyl 50 mcg was administered intravenously. Moderate Sedation Time: 40 minutes. The patient's level of consciousness and vital signs were monitored continuously by radiology nursing throughout the procedure under my direct supervision. CONTRAST:  33mL OMNIPAQUE IOHEXOL 300 MG/ML  SOLN FLUOROSCOPY TIME:  13 minutes, 12 seconds (962 mGy) COMPLICATIONS: None immediate. TECHNIQUE: Informed written consent was obtained from the patient after a discussion of the risks, benefits and  alternatives to treatment. Questions regarding the procedure were encouraged and answered. A timeout was performed prior to the initiation of the procedure. Ultrasound scanning was performed of the right groin and demonstrated wide patency of the right common femoral vein. As such, the right common femoral vein was selected venous access. The right groin was prepped and draped in the usual sterile fashion, and a sterile drape was applied covering the  operative field. Maximum barrier sterile technique with sterile gowns and gloves were used for the procedure. A timeout was performed prior to the initiation of the procedure. Local anesthesia was provided with 1% lidocaine. Under direct ultrasound guidance, the right common femoral vein was accessed with a micro puncture kit ultimately allowing placement of a 6 French vascular sheath. Slightly cranial to this initial access, the right common femoral was again accessed with an additional micropuncture kit ultimately allowing placement of an additional 6 French vascular sheath. Ultrasound images were saved for procedural documentation purposes. With the use of a glidewire, a vertebral catheter was advanced into the main pulmonary artery and a limited central pulmonary arteriogram was performed. Pressure measurements were then obtained from the main pulmonary artery. The vertebral catheter was advanced into the distal branch of the left lower lobe pulmonary artery. Limited contrast injection confirmed appropriate positioning. Over an exchange length stiff glidewire, the vertebral catheter was exchanged for a 90/10 cm multi side-hole infusion catheter. With the use of a stiff glidewire, a vertebral catheter was advanced into a distal branch of the right lower lobe pulmonary artery. Limited contrast injection confirmed appropriate positioning. Over an exchange length stiff glidewire, the catheter was exchanged for a 90/15 cm multi side-hole infusion catheter. A  postprocedural fluoroscopic image was obtained to document final catheter positioning. The external catheter tubing was secured at the right thigh and the lytic therapy was initiated. The patient tolerated the procedure well without immediate postprocedural complication. FINDINGS: Central pulmonary arteriogram confirms appropriate positioning for pulmonary arterial pressure measurements. Acquired pressure measurements: Main  pulmonary artery - 98/44; mean - 66 (normal: < 25/10) (Note, I feel the pulmonary arterial pressure measurements may be artifactually elevated due to being sampled with the patient positioned with head elevated approximately 25 degrees due to respiratory distress). Following the procedure, both ultrasound assisted infusion catheter tips terminate within the distal aspects of the bilateral lower lobe sub segmental pulmonary arteries. IMPRESSION: 1. Successful fluoroscopic guided initiation of bilateral ultrasound assisted catheter directed pulmonary arterial lysis for sub massive pulmonary embolism and right-sided heart strain. 2. Elevated pressure measurements within the main pulmonary artery compatible with critical pulmonary arterial hypertension,, though note, acquired pulmonary arterial pressure measurements may be artifactually elevated due to sampling with the patient positioned with the head of bed elevated approximately 25 degrees due to respiratory distress. PLAN: Initiate 12 hour tPA infusion per pulmonary arterial catheter lysis protocol with a.m. chest radiograph and plan for bedside pressure measurement tomorrow morning. Electronically Signed   By: Sandi Mariscal M.D.   On: 01/31/2022 15:50   IR Angiogram Selective Each Additional Vessel  Result Date: 01/31/2022 INDICATION: Sub massive pulmonary embolism, not improving with conservative management. Given the peripheral predominant segmental and subsegmental bilateral pulmonary embolism demonstrated on chest CTA performed  01/29/2022, request made for initiation catheter directed pulmonary arterial thrombolysis. EXAM: 1. ULTRASOUND GUIDANCE FOR VENOUS ACCESS X2 2. PULMONARY ARTERIOGRAPHY 3. FLUOROSCOPIC GUIDED PLACEMENT OF BILATERAL PULMONARY ARTERIAL LYTIC INFUSION CATHETERS COMPARISON:  Chest CTA-01/29/2022 MEDICATIONS: None ANESTHESIA/SEDATION: Moderate (conscious) sedation was employed during this procedure as administered by the Interventional Radiology RN. A total of Versed 0.5 mg and Fentanyl 50 mcg was administered intravenously. Moderate Sedation Time: 40 minutes. The patient's level of consciousness and vital signs were monitored continuously by radiology nursing throughout the procedure under my direct supervision. CONTRAST:  67mL OMNIPAQUE IOHEXOL 300 MG/ML  SOLN FLUOROSCOPY TIME:  13 minutes, 12 seconds (711 mGy) COMPLICATIONS: None immediate. TECHNIQUE: Informed written consent was  obtained from the patient after a discussion of the risks, benefits and alternatives to treatment. Questions regarding the procedure were encouraged and answered. A timeout was performed prior to the initiation of the procedure. Ultrasound scanning was performed of the right groin and demonstrated wide patency of the right common femoral vein. As such, the right common femoral vein was selected venous access. The right groin was prepped and draped in the usual sterile fashion, and a sterile drape was applied covering the operative field. Maximum barrier sterile technique with sterile gowns and gloves were used for the procedure. A timeout was performed prior to the initiation of the procedure. Local anesthesia was provided with 1% lidocaine. Under direct ultrasound guidance, the right common femoral vein was accessed with a micro puncture kit ultimately allowing placement of a 6 French vascular sheath. Slightly cranial to this initial access, the right common femoral was again accessed with an additional micropuncture kit ultimately allowing  placement of an additional 6 French vascular sheath. Ultrasound images were saved for procedural documentation purposes. With the use of a glidewire, a vertebral catheter was advanced into the main pulmonary artery and a limited central pulmonary arteriogram was performed. Pressure measurements were then obtained from the main pulmonary artery. The vertebral catheter was advanced into the distal branch of the left lower lobe pulmonary artery. Limited contrast injection confirmed appropriate positioning. Over an exchange length stiff glidewire, the vertebral catheter was exchanged for a 90/10 cm multi side-hole infusion catheter. With the use of a stiff glidewire, a vertebral catheter was advanced into a distal branch of the right lower lobe pulmonary artery. Limited contrast injection confirmed appropriate positioning. Over an exchange length stiff glidewire, the catheter was exchanged for a 90/15 cm multi side-hole infusion catheter. A postprocedural fluoroscopic image was obtained to document final catheter positioning. The external catheter tubing was secured at the right thigh and the lytic therapy was initiated. The patient tolerated the procedure well without immediate postprocedural complication. FINDINGS: Central pulmonary arteriogram confirms appropriate positioning for pulmonary arterial pressure measurements. Acquired pressure measurements: Main  pulmonary artery - 98/44; mean - 66 (normal: < 25/10) (Note, I feel the pulmonary arterial pressure measurements may be artifactually elevated due to being sampled with the patient positioned with head elevated approximately 25 degrees due to respiratory distress). Following the procedure, both ultrasound assisted infusion catheter tips terminate within the distal aspects of the bilateral lower lobe sub segmental pulmonary arteries. IMPRESSION: 1. Successful fluoroscopic guided initiation of bilateral ultrasound assisted catheter directed pulmonary arterial lysis  for sub massive pulmonary embolism and right-sided heart strain. 2. Elevated pressure measurements within the main pulmonary artery compatible with critical pulmonary arterial hypertension,, though note, acquired pulmonary arterial pressure measurements may be artifactually elevated due to sampling with the patient positioned with the head of bed elevated approximately 25 degrees due to respiratory distress. PLAN: Initiate 12 hour tPA infusion per pulmonary arterial catheter lysis protocol with a.m. chest radiograph and plan for bedside pressure measurement tomorrow morning. Electronically Signed   By: Sandi Mariscal M.D.   On: 01/31/2022 15:50   IR Angiogram Selective Each Additional Vessel  Result Date: 01/31/2022 INDICATION: Sub massive pulmonary embolism, not improving with conservative management. Given the peripheral predominant segmental and subsegmental bilateral pulmonary embolism demonstrated on chest CTA performed 01/29/2022, request made for initiation catheter directed pulmonary arterial thrombolysis. EXAM: 1. ULTRASOUND GUIDANCE FOR VENOUS ACCESS X2 2. PULMONARY ARTERIOGRAPHY 3. FLUOROSCOPIC GUIDED PLACEMENT OF BILATERAL PULMONARY ARTERIAL LYTIC INFUSION CATHETERS COMPARISON:  Chest CTA-01/29/2022  MEDICATIONS: None ANESTHESIA/SEDATION: Moderate (conscious) sedation was employed during this procedure as administered by the Interventional Radiology RN. A total of Versed 0.5 mg and Fentanyl 50 mcg was administered intravenously. Moderate Sedation Time: 40 minutes. The patient's level of consciousness and vital signs were monitored continuously by radiology nursing throughout the procedure under my direct supervision. CONTRAST:  65mL OMNIPAQUE IOHEXOL 300 MG/ML  SOLN FLUOROSCOPY TIME:  13 minutes, 12 seconds (220 mGy) COMPLICATIONS: None immediate. TECHNIQUE: Informed written consent was obtained from the patient after a discussion of the risks, benefits and alternatives to treatment. Questions regarding  the procedure were encouraged and answered. A timeout was performed prior to the initiation of the procedure. Ultrasound scanning was performed of the right groin and demonstrated wide patency of the right common femoral vein. As such, the right common femoral vein was selected venous access. The right groin was prepped and draped in the usual sterile fashion, and a sterile drape was applied covering the operative field. Maximum barrier sterile technique with sterile gowns and gloves were used for the procedure. A timeout was performed prior to the initiation of the procedure. Local anesthesia was provided with 1% lidocaine. Under direct ultrasound guidance, the right common femoral vein was accessed with a micro puncture kit ultimately allowing placement of a 6 French vascular sheath. Slightly cranial to this initial access, the right common femoral was again accessed with an additional micropuncture kit ultimately allowing placement of an additional 6 French vascular sheath. Ultrasound images were saved for procedural documentation purposes. With the use of a glidewire, a vertebral catheter was advanced into the main pulmonary artery and a limited central pulmonary arteriogram was performed. Pressure measurements were then obtained from the main pulmonary artery. The vertebral catheter was advanced into the distal branch of the left lower lobe pulmonary artery. Limited contrast injection confirmed appropriate positioning. Over an exchange length stiff glidewire, the vertebral catheter was exchanged for a 90/10 cm multi side-hole infusion catheter. With the use of a stiff glidewire, a vertebral catheter was advanced into a distal branch of the right lower lobe pulmonary artery. Limited contrast injection confirmed appropriate positioning. Over an exchange length stiff glidewire, the catheter was exchanged for a 90/15 cm multi side-hole infusion catheter. A postprocedural fluoroscopic image was obtained to document  final catheter positioning. The external catheter tubing was secured at the right thigh and the lytic therapy was initiated. The patient tolerated the procedure well without immediate postprocedural complication. FINDINGS: Central pulmonary arteriogram confirms appropriate positioning for pulmonary arterial pressure measurements. Acquired pressure measurements: Main  pulmonary artery - 98/44; mean - 66 (normal: < 25/10) (Note, I feel the pulmonary arterial pressure measurements may be artifactually elevated due to being sampled with the patient positioned with head elevated approximately 25 degrees due to respiratory distress). Following the procedure, both ultrasound assisted infusion catheter tips terminate within the distal aspects of the bilateral lower lobe sub segmental pulmonary arteries. IMPRESSION: 1. Successful fluoroscopic guided initiation of bilateral ultrasound assisted catheter directed pulmonary arterial lysis for sub massive pulmonary embolism and right-sided heart strain. 2. Elevated pressure measurements within the main pulmonary artery compatible with critical pulmonary arterial hypertension,, though note, acquired pulmonary arterial pressure measurements may be artifactually elevated due to sampling with the patient positioned with the head of bed elevated approximately 25 degrees due to respiratory distress. PLAN: Initiate 12 hour tPA infusion per pulmonary arterial catheter lysis protocol with a.m. chest radiograph and plan for bedside pressure measurement tomorrow morning. Electronically Signed  By: Sandi Mariscal M.D.   On: 01/31/2022 15:50   VAS [place] LOWER EXTREMITY VENOUS (DVT)  Result Date: 01/30/2022  Lower Venous DVT Study Patient Name:  MARYLAND STELL  Date of Exam:   01/30/2022 Medical Rec #: [id number]        Accession #:    [id number] Date of Birth: [birth year]         Patient Gender: F Patient Age:   33 years Exam Location:  [facility] Procedure:      VAS [place] LOWER EXTREMITY  VENOUS (DVT) Referring Phys: Jerald Kief REGALADO --------------------------------------------------------------------------------  Indications: Pulmonary embolism.  Comparison Study: no prior Performing Technologist: Archie Patten RVS  Examination Guidelines: A complete evaluation includes B-mode imaging, spectral Doppler, color Doppler, and power Doppler as needed of all accessible portions of each vessel. Bilateral testing is considered an integral part of a complete examination. Limited examinations for reoccurring indications may be performed as noted. The reflux portion of the exam is performed with the patient in reverse Trendelenburg.  +---------+---------------+---------+-----------+----------+--------------+ RIGHT    CompressibilityPhasicitySpontaneityPropertiesThrombus Aging +---------+---------------+---------+-----------+----------+--------------+ CFV      Full           Yes      Yes                                 +---------+---------------+---------+-----------+----------+--------------+ SFJ      Full                                                        +---------+---------------+---------+-----------+----------+--------------+ FV Prox  Full                                                        +---------+---------------+---------+-----------+----------+--------------+ FV Mid   Full                                                        +---------+---------------+---------+-----------+----------+--------------+ FV DistalFull                                                        +---------+---------------+---------+-----------+----------+--------------+ PFV      Full                                                        +---------+---------------+---------+-----------+----------+--------------+ POP      Full           Yes      Yes                                 +---------+---------------+---------+-----------+----------+--------------+  PTV       Full                                                        +---------+---------------+---------+-----------+----------+--------------+ PERO     Full                                                        +---------+---------------+---------+-----------+----------+--------------+   +---------+---------------+---------+-----------+----------+--------------+ LEFT     CompressibilityPhasicitySpontaneityPropertiesThrombus Aging +---------+---------------+---------+-----------+----------+--------------+ CFV      Full           Yes      Yes                                 +---------+---------------+---------+-----------+----------+--------------+ SFJ      Full                                                        +---------+---------------+---------+-----------+----------+--------------+ FV Prox  Full                                                        +---------+---------------+---------+-----------+----------+--------------+ FV Mid   Full                                                        +---------+---------------+---------+-----------+----------+--------------+ FV DistalFull                                                        +---------+---------------+---------+-----------+----------+--------------+ PFV      Full                                                        +---------+---------------+---------+-----------+----------+--------------+ POP      Full           Yes      Yes                                 +---------+---------------+---------+-----------+----------+--------------+ PTV      Full                                                        +---------+---------------+---------+-----------+----------+--------------+  PERO     Full                                                        +---------+---------------+---------+-----------+----------+--------------+     Summary: BILATERAL: - No evidence of deep vein  thrombosis seen in the lower extremities, bilaterally. -No evidence of popliteal cyst, bilaterally.   *See table(s) above for measurements and observations. Electronically signed by Deitra Mayo MD on 01/30/2022 at 4:54:07 PM.    Final    ECHOCARDIOGRAM COMPLETE  Result Date: 01/30/2022    ECHOCARDIOGRAM REPORT   Patient Name:   ARELLA BLINDER Date of Exam: 01/30/2022 Medical Rec #:  [id number]       Height:       70.0 in Accession #:    [id number]      Weight:       227.5 lb Date of Birth:  27-Oct-1980        BSA:          2.205 m Patient Age:    87 years        BP:           136/102 mmHg Patient Gender: F               HR:           99 bpm. Exam Location:  Inpatient Procedure: 2D Echo, Cardiac Doppler and Color Doppler Indications:    Pulmonary Embolus I26.09  History:        Patient has prior history of Echocardiogram examinations, most                 recent 05/12/2021.  Sonographer:    Eartha Inch Referring Phys: [id number] [place]  Sonographer Comments: Suboptimal apical window and suboptimal subcostal window. Image acquisition challenging due to patient body habitus. IMPRESSIONS  1. Right ventricular systolic function is mildly reduced; RV McConnell's Sign. The right ventricular size is moderately enlarged. There is moderately elevated pulmonary artery systolic pressure. The estimated right ventricular systolic pressure is 30.8 mmHg.  2. Left ventricular ejection fraction, by estimation, is 65 to 70%. The left ventricle has normal function. The left ventricle has no regional wall motion abnormalities. Left ventricular diastolic parameters were normal. There is the interventricular septum is flattened in diastole ('D' shaped left ventricle), consistent with right ventricular volume overload.  3. A small pericardial effusion is present. No evidence of respirophasic RV collapse, but there is tachycardia, RV changes as noted above, and IVC plethora  4. The mitral valve is normal in structure. No  evidence of mitral valve regurgitation. No evidence of mitral stenosis.  5. The aortic valve is tricuspid. Aortic valve regurgitation is not visualized. No aortic stenosis is present. Comparison(s): Unable to see prior study. RV changes appears to be new, there is a small, apical pericardial effusion with abnormal RV function; there is evidence of RV strain. FINDINGS  Left Ventricle: Left ventricular ejection fraction, by estimation, is 65 to 70%. The left ventricle has normal function. The left ventricle has no regional wall motion abnormalities. The left ventricular internal cavity size was normal in size. There is  no left ventricular hypertrophy. The interventricular septum is flattened in diastole ('D' shaped left ventricle), consistent with right ventricular volume overload. Left ventricular diastolic parameters were normal. Right Ventricle: The right  ventricular size is moderately enlarged. No increase in right ventricular wall thickness. Right ventricular systolic function is mildly reduced. There is moderately elevated pulmonary artery systolic pressure. The tricuspid regurgitant velocity is 3.49 m/s, and with an assumed right atrial pressure of 8 mmHg, the estimated right ventricular systolic pressure is 37.6 mmHg. Left Atrium: Left atrial size was normal in size. Right Atrium: Right atrial size was normal in size. Pericardium: A small pericardial effusion is present. There is excessive respiratory variation in septal movement and diastolic collapse of the right ventricular free wall. Mitral Valve: The mitral valve is normal in structure. No evidence of mitral valve regurgitation. No evidence of mitral valve stenosis. Tricuspid Valve: The tricuspid valve is normal in structure. Tricuspid valve regurgitation is mild . No evidence of tricuspid stenosis. Aortic Valve: The aortic valve is tricuspid. Aortic valve regurgitation is not visualized. No aortic stenosis is present. Pulmonic Valve: The pulmonic valve  was grossly normal. Pulmonic valve regurgitation is trivial. No evidence of pulmonic stenosis. Aorta: The aortic root and ascending aorta are structurally normal, with no evidence of dilitation. IAS/Shunts: No atrial level shunt detected by color flow Doppler.  LEFT VENTRICLE PLAX 2D LVIDd:         4.40 cm   Diastology LVIDs:         2.70 cm   LV e' medial:  8.27 cm/s LV PW:         1.20 cm   LV e' lateral: 14.30 cm/s LV IVS:        1.00 cm LVOT diam:     2.00 cm LV SV:         36 LV SV Index:   16 LVOT Area:     3.14 cm  RIGHT VENTRICLE             IVC RV S prime:     11.40 cm/s  IVC diam: 2.20 cm TAPSE (M-mode): 1.6 cm LEFT ATRIUM             Index        RIGHT ATRIUM           Index LA diam:        2.40 cm 1.09 cm/m   RA Area:     13.40 cm LA Vol (A2C):   22.9 ml 10.39 ml/m  RA Volume:   35.50 ml  16.10 ml/m LA Vol (A4C):   9.6 ml  4.36 ml/m LA Biplane Vol: 14.6 ml 6.62 ml/m  AORTIC VALVE LVOT Vmax:   79.60 cm/s LVOT Vmean:  53.600 cm/s LVOT VTI:    0.115 m  AORTA Ao Root diam: 3.00 cm Ao Asc diam:  3.10 cm TRICUSPID VALVE TR Peak grad:   48.7 mmHg TR Vmax:        349.00 cm/s  SHUNTS Systemic VTI:  0.12 m Systemic Diam: 2.00 cm Rudean Haskell MD Electronically signed by Rudean Haskell MD Signature Date/Time: 01/30/2022/11:15:43 AM    Final    CT Angio Chest PE W and/or Wo Contrast  Result Date: 01/29/2022 CLINICAL DATA:  Shortness of breath and cough.  Chest pain. EXAM: CT ANGIOGRAPHY CHEST WITH CONTRAST TECHNIQUE: Multidetector CT imaging of the chest was performed using the standard protocol during bolus administration of intravenous contrast. Multiplanar CT image reconstructions and MIPs were obtained to evaluate the vascular anatomy. RADIATION DOSE REDUCTION: This exam was performed according to the departmental dose-optimization program which includes automated exposure control, adjustment of the mA and/or kV according to patient  size and/or use of iterative reconstruction technique.  CONTRAST:  128mL OMNIPAQUE IOHEXOL 350 MG/ML SOLN COMPARISON:  Chest x-ray January 29, 2022 FINDINGS: Cardiovascular: The thoracic aorta is normal. The RV/LV ratio is 1.31. The heart is otherwise normal in appearance. No coronary artery disease identified on limited views. Significant bilateral pulmonary emboli are identified involving the distal left main pulmonary artery, extending into the upper and lower lobe segmental branches. Embolus is also identified in the distal right main pulmonary embolus, extending into upper, middle, and lower lobe branches. Mediastinum/Nodes: No enlarged mediastinal, hilar, or axillary lymph nodes. Thyroid gland, trachea, and esophagus demonstrate no significant findings. Lungs/Pleura: Bilateral pulmonary infiltrates are identified, involving all lobes but most pronounced in the left upper lobe. There is dense consolidation in the periphery of the left upper lobe, extending to the left hilum, well appreciated on coronal image 47. There are air bronchograms within this dense consolidation which extends to the pleural surface. Elsewhere, there are scattered primarily ground-glass infiltrates located centrally and peripherally. Peripheral ground-glass is identified in the lateral right upper lobe on series 5, image 24. Evaluation for nodules or masses is limited due to the pulmonary infiltrates but no suspicious masses are identified. Upper Abdomen: No acute abnormality. Musculoskeletal: No chest wall abnormality. No acute or significant osseous findings. Review of the MIP images confirms the above findings. IMPRESSION: 1. Extensive pulmonary emboli involving the distal aspects of both main pulmonary arteries and multiple segmental branches with an elevated RV/LV ratio of 1.31. Positive for acute PE with CT evidence of right heart strain (RV/LV Ratio = 1.31) consistent with at least submassive (intermediate risk) PE. The presence of right heart strain has been associated with an  increased risk of morbidity and mortality. Please refer to the "Code PE Focused" order set in EPIC. 2. Bilateral pulmonary infiltrates. Overall, the pattern is that of infection with scattered ground-glass infiltrates. The more focal dense infiltrate in the periphery of the left upper lobe extending to the hilum could represent infection. However, in the setting of pulmonary emboli, developing infarct should be considered. Findings called to Dr. Regan Lemming. Electronically Signed   By: Dorise Bullion III M.D.   On: 01/29/2022 14:34   DG Chest 2 View  Result Date: 01/29/2022 CLINICAL DATA:  Shortness of breath and cough. EXAM: CHEST - 2 VIEW COMPARISON:  None Available. FINDINGS: Heart size and mediastinal contours are unremarkable. No pleural effusion identified. Left upper lobe airspace disease is identified concerning for pneumonia. Right lung appears clear. Visualized osseous structures are unremarkable. IMPRESSION: Left upper lobe airspace disease concerning for pneumonia. Followup PA and lateral chest X-ray is recommended in 3-4 weeks following trial of antibiotic therapy to ensure resolution and exclude underlying malignancy. Electronically Signed   By: Kerby Moors M.D.   On: 01/29/2022 11:49       The results of significant diagnostics from this hospitalization (including imaging, microbiology, ancillary and laboratory) are listed below for reference.     Microbiology: Recent Results (from the past 240 hour(s))  Blood culture (routine x 2)     Status: None   Collection Time: 01/29/22  2:46 PM   Specimen: BLOOD  Result Value Ref Range Status   Specimen Description   Final    BLOOD LEFT ANTECUBITAL Performed at [facility], [address] [place], [place] [address]    Special Requests   Final    BOTTLES DRAWN AEROBIC AND ANAEROBIC Blood Culture adequate volume Performed at [facility]  [address], [place], [place] [address]    Culture   Final    NO  GROWTH 5 DAYS Performed at [facility], [place] [address] [place], [place] [address]    Report Status 02/03/2022 FINAL  Final  Blood culture (routine x 2)     Status: None   Collection Time: 01/29/22  2:46 PM   Specimen: BLOOD  Result Value Ref Range Status   Specimen Description   Final    BLOOD BLOOD LEFT FOREARM Performed at [facility], [address] [place], [place] [address]    Special Requests   Final    BOTTLES DRAWN AEROBIC AND ANAEROBIC Blood Culture results may not be optimal due to an inadequate volume of blood received in culture bottles Performed at [facility], [address] [place], [place] [address]    Culture   Final    NO GROWTH 5 DAYS Performed at [facility], [place] [address], [place], [place] [address]    Report Status 02/03/2022 FINAL  Final  Resp Panel by RT-PCR (Flu A&B, Covid) Anterior Nasal Swab     Status: None   Collection Time: 01/31/22 10:19 AM   Specimen: Anterior Nasal Swab  Result Value Ref Range Status   SARS Coronavirus 2 by RT PCR NEGATIVE NEGATIVE Final    Comment: (NOTE) SARS-CoV-2 target nucleic acids are NOT DETECTED.  The SARS-CoV-2 RNA is generally detectable in upper respiratory specimens during the acute phase of infection. The lowest concentration of SARS-CoV-2 viral copies this assay can detect is 138 copies/mL. A negative result does not preclude SARS-Cov-2 infection and should not be used as the sole basis for treatment or other patient management decisions. A negative result may occur with  improper specimen collection/handling, submission of specimen other than nasopharyngeal swab, presence of viral mutation(s) within the areas targeted by this assay, and inadequate number of viral copies(<138 copies/mL). A negative result must be combined with clinical observations, patient history, and epidemiological information. The expected result is Negative.  Fact Sheet for Patients:   EntrepreneurPulse.com.au  Fact Sheet for Healthcare Providers:  IncredibleEmployment.be  This test is no t yet approved or cleared by the [place] FDA and  has been authorized for detection and/or diagnosis of SARS-CoV-2 by FDA under an Emergency Use Authorization (EUA). This EUA will remain  in effect (meaning this test can be used) for the duration of the COVID-19 declaration under Section 564(b)(1) of the Act, 21 U.S.C.section 360bbb-3(b)(1), unless the authorization is terminated  or revoked sooner.       Influenza A by PCR NEGATIVE NEGATIVE Final   Influenza B by PCR NEGATIVE NEGATIVE Final    Comment: (NOTE) The Xpert Xpress SARS-CoV-2/FLU/RSV plus assay is intended as an aid in the diagnosis of influenza from Nasopharyngeal swab specimens and should not be used as a sole basis for treatment. Nasal washings and aspirates are unacceptable for Xpert Xpress SARS-CoV-2/FLU/RSV testing.  Fact Sheet for Patients: EntrepreneurPulse.com.au  Fact Sheet for Healthcare Providers: IncredibleEmployment.be  This test is not yet approved or cleared by the [place] FDA and has been authorized for detection and/or diagnosis of SARS-CoV-2 by FDA under an Emergency Use Authorization (EUA). This EUA will remain in effect (meaning this test can be used) for the duration of the COVID-19 declaration under Section 564(b)(1) of the Act, 21 U.S.C. section 360bbb-3(b)(1), unless the authorization is terminated or revoked.  Performed at [facility]  Lab, [address] [place], [place] [address]   MRSA Next Gen by PCR, Nasal     Status: None   Collection Time: 01/31/22  2:48 PM   Specimen: Anterior Nasal Swab  Result Value Ref Range Status   MRSA by PCR Next Gen NOT DETECTED NOT DETECTED Final    Comment: (NOTE) The GeneXpert MRSA Assay (FDA approved for NASAL specimens only), is one component of a  comprehensive MRSA colonization surveillance program. It is not intended to diagnose MRSA infection nor to guide or monitor treatment for MRSA infections. Test performance is not FDA approved in patients less than 50 years old. Performed at [facility], [place] [address] [place],  [address]      Labs:  CBC: Recent Labs  Lab 01/29/22 1252 01/29/22 1614 02/01/22 0435 02/01/22 0828 02/01/22 1445 02/02/22 0021 02/02/22 0607 02/03/22 0248  WBC 11.2*   < > 10.4 11.5* 11.0* 11.2*  --  9.3  NEUTROABS 7.8*  --   --   --   --   --   --   --   HGB 15.0   < > 14.3 12.0 13.5 10.8* 10.1* 9.7*  HCT 44.7   < > 43.4 36.4 40.1 32.5* 29.6* 28.4*  MCV 89.8   < > 90.8 91.5 90.3 91.0  --  89.0  PLT 267   < > 184 146* 145* 132*  --  151   < > = values in this interval not displayed.   BMP &GFR Recent Labs  Lab 01/29/22 2226 01/30/22 0417 01/31/22 0716 02/01/22 0435 02/02/22 0021 02/03/22 0248  NA  --  136 138 137 139 140  K  --  4.2 4.5 4.4 3.5 4.0  CL  --  111 108 111 112* 110  CO2  --  19* 20* 19* 21* 19*  GLUCOSE  --  100* 93 103* 104* 95  BUN  --  $R'6 7 7 6 'Pa$ <5*  CREATININE  --  1.02* 1.20* 1.07* 0.93 0.86  CALCIUM  --  8.0* 8.5* 7.8* 7.6* 8.1*  MG 2.1 2.0  --  2.0  --   --   PHOS  --  2.9  --   --   --   --    Estimated Creatinine Clearance: 112.5 mL/min (by C-G formula based on SCr of 0.86 mg/dL). Liver & Pancreas: Recent Labs  Lab 01/30/22 0417 02/01/22 0435  AST 29 44*  ALT 42 63*  ALKPHOS 45 46  BILITOT 0.5 0.5  PROT 5.9* 5.8*  ALBUMIN 2.8* 2.6*   No results for input(s): "LIPASE", "AMYLASE" in the last 168 hours. No results for input(s): "AMMONIA" in the last 168 hours. Diabetic: No results for input(s): "HGBA1C" in the last 72 hours. Recent Labs  Lab 01/31/22 1909  GLUCAP 72   Cardiac Enzymes: No results for input(s): "CKTOTAL", "CKMB", "CKMBINDEX", "TROPONINI" in the last 168 hours. No results for input(s): "PROBNP" in the last 8760  hours. Coagulation Profile: Recent Labs  Lab 01/29/22 1445  INR 1.1   Thyroid Function Tests: No results for input(s): "TSH", "T4TOTAL", "FREET4", "T3FREE", "THYROIDAB" in the last 72 hours. Lipid Profile: No results for input(s): "CHOL", "HDL", "LDLCALC", "TRIG", "CHOLHDL", "LDLDIRECT" in the last 72 hours. Anemia Panel: Recent Labs    02/03/22 0248  VITAMINB12 949*  FOLATE 6.9  FERRITIN 31  TIBC 315  IRON 38  RETICCTPCT 3.9*   Urine analysis:    Component Value Date/Time   COLORURINE YELLOW 05/21/2011  2120   APPEARANCEUR CLEAR 05/21/2011 2120   LABSPEC 1.025 05/21/2011 2120   PHURINE 6.0 05/21/2011 2120   GLUCOSEU NEGATIVE 05/21/2011 2120   HGBUR NEGATIVE 05/21/2011 2120   [place] 05/21/2011 2120   KETONESUR NEGATIVE 05/21/2011 2120   PROTEINUR NEGATIVE 05/21/2011 2120   UROBILINOGEN 0.2 05/21/2011 2120   NITRITE NEGATIVE 05/21/2011 2120   LEUKOCYTESUR NEGATIVE 05/21/2011 2120   Sepsis Labs: Invalid input(s): "PROCALCITONIN", "LACTICIDVEN"   SIGNED:  Mercy Riding, MD  [facility] 02/03/2022, 5:55 PM

## 2022-02-03 NOTE — TOC Transition Note (Signed)
Transition of Care [facility]) - CM/SW Discharge Note   Patient Details  Name: Norma Schmidt MRN: [id number] Date of Birth: [birth year]  Transition of Care [facility]) CM/SW Contact:  Kallie Locks, RN Phone Number: [phone] 02/03/2022, 1:50 PM   Clinical Narrative:   Provided Eliquis 30-day free pharmacy card to [name]. She is discharging home today. Lives with family. Friend to pick up from hospital today. States she has PCP follow up on Tuesday, 02/06/22. No further TOC needs identified.       Final next level of care: Home/Self Care Barriers to Discharge: No Barriers Identified   Patient Goals and CMS Choice Patient states their goals for this hospitalization and ongoing recovery are:: return home      Discharge Placement                       Discharge Plan and Services                                     Social Determinants of Health (SDOH) Interventions     Readmission Risk Interventions     No data to display

## 2022-02-03 NOTE — Discharge Instructions (Signed)
Information on my medicine - ELIQUIS (apixaban)  Why was Eliquis prescribed for you? Eliquis was prescribed to treat blood clots that may have been found in the veins of your legs (deep vein thrombosis) or in your lungs (pulmonary embolism) and to reduce the risk of them occurring again.  What do You need to know about Eliquis ? The starting dose is 10 mg (two 5 mg tablets) taken TWICE daily for the FIRST SEVEN (7) DAYS, then on  02/10/2022  the dose is reduced to ONE 5 mg tablet taken TWICE daily.  Eliquis may be taken with or without food.   Try to take the dose about the same time in the morning and in the evening. If you have difficulty swallowing the tablet whole please discuss with your pharmacist how to take the medication safely.  Take Eliquis exactly as prescribed and DO NOT stop taking Eliquis without talking to the doctor who prescribed the medication.  Stopping may increase your risk of developing a new blood clot.  Refill your prescription before you run out.  After discharge, you should have regular check-up appointments with your healthcare provider that is prescribing your Eliquis.    What do you do if you miss a dose? If a dose of ELIQUIS is not taken at the scheduled time, take it as soon as possible on the same day and twice-daily administration should be resumed. The dose should not be doubled to make up for a missed dose.  Important Safety Information A possible side effect of Eliquis is bleeding. You should call your healthcare provider right away if you experience any of the following: Bleeding from an injury or your nose that does not stop. Unusual colored urine (red or dark brown) or unusual colored stools (red or black). Unusual bruising for unknown reasons. A serious fall or if you hit your head (even if there is no bleeding).  Some medicines may interact with Eliquis and might increase your risk of bleeding or clotting while on Eliquis. To help avoid  this, consult your healthcare provider or pharmacist prior to using any new prescription or non-prescription medications, including herbals, vitamins, non-steroidal anti-inflammatory drugs (NSAIDs) and supplements.  This website has more information on Eliquis (apixaban): http://www.eliquis.com/eliquis/home

## 2022-02-03 NOTE — Progress Notes (Signed)
ANTICOAGULATION CONSULT NOTE - Initial Consult  Pharmacy Consult for apixaban Indication: pulmonary embolus  No Known Allergies  Patient Measurements: Height: 5\' 10"  (177.8 cm) Weight: 104.3 kg (230 lb) IBW/kg (Calculated) : 68.5  Vital Signs: Temp: 97.9 F (36.6 C) (09/02 0733) Temp Source: Oral (09/02 0733) BP: 147/98 (09/02 0733) Pulse Rate: 114 (09/02 0733)  Labs: Recent Labs    02/01/22 0435 02/01/22 0828 02/01/22 1445 02/01/22 2110 02/02/22 0021 02/02/22 0607 02/03/22 0248  HGB 14.3   < > 13.5  --  10.8* 10.1* 9.7*  HCT 43.4   < > 40.1  --  32.5* 29.6* 28.4*  PLT 184   < > 145*  --  132*  --  151  HEPARINUNFRC  --    < > 0.47 0.54  --  0.46 0.45  CREATININE 1.07*  --   --   --  0.93  --  0.86   < > = values in this interval not displayed.    Estimated Creatinine Clearance: 112.5 mL/min (by C-G formula based on SCr of 0.86 mg/dL).   Medical History: Past Medical History:  Diagnosis Date   Abnormal Pap smear    Herpes simplex type 2 infection    History of chicken pox    History of uterine fibroid    Positive ANA (antinuclear antibody)     Medications:  Medications Prior to Admission  Medication Sig Dispense Refill Last Dose   ibuprofen (ADVIL) 200 MG tablet Take 400 mg by mouth every 6 (six) hours as needed for mild pain.   unk   JUNEL FE 1.5/30 1.5-30 MG-MCG tablet Take 1 tablet by mouth daily.   01/28/2022   Multiple Vitamin (MULTIVITAMIN) capsule Take 1 capsule by mouth daily.   01/28/2022   rizatriptan (MAXALT-MLT) 10 MG disintegrating tablet Take 1 tablet earliest onset of migraine.  May repeat in 2 hours if needed.  Maximum 2 tablets in 24 hours. 10 tablet 5 unk   topiramate (TOPAMAX) 50 MG tablet Take 1 tablet (50 mg total) by mouth at bedtime. (Patient not taking: Reported on 01/30/2022) 30 tablet 5 Not Taking   Infusions:   sodium chloride 10 mL/hr at 02/02/22 1400   heparin 1,600 Units/hr (02/03/22 0225)    Assessment: 61 yoF who presented  with bilateral PE. Pharmacy has been consulted for Eliquis dosing. She was not on anticoagulation prior to admission and is being transitioned from heparin to Eliquis.  CBC stable, no issues with bleeding 9/2 with heparin drip. Will continue to monitor for signs/symptoms of bleeding  Goal of Therapy:  Monitor platelets by anticoagulation protocol: Yes   Plan:  Start Eliquis 10mg  BID for 7 days followed by 5mg  by mouth BID Discontinue heparin gtt Continue to monitor CBC and sign/symptoms of bleeding  11/2, PharmD. [facility] Acute Care PGY-1  02/03/2022 9:50 AM

## 2022-02-03 NOTE — Progress Notes (Signed)
Pt ambulated in the hall without O2 and consistently stayed at 92-95% O2 levels throughout ambulation.

## 2022-02-06 LAB — PROTHROMBIN GENE MUTATION

## 2022-02-06 LAB — FACTOR 5 LEIDEN

## 2022-07-17 ENCOUNTER — Emergency Department (HOSPITAL_BASED_OUTPATIENT_CLINIC_OR_DEPARTMENT_OTHER): Payer: Federal, State, Local not specified - PPO

## 2022-07-17 ENCOUNTER — Other Ambulatory Visit: Payer: Self-pay

## 2022-07-17 ENCOUNTER — Emergency Department (HOSPITAL_BASED_OUTPATIENT_CLINIC_OR_DEPARTMENT_OTHER)
Admission: EM | Admit: 2022-07-17 | Discharge: 2022-07-18 | Disposition: A | Discharge status: Home / Self Care | Payer: Federal, State, Local not specified - PPO | Attending: Emergency Medicine | Admitting: Emergency Medicine

## 2022-07-17 DIAGNOSIS — R0602 Shortness of breath: Secondary | ICD-10-CM | POA: Insufficient documentation

## 2022-07-17 DIAGNOSIS — Z7901 Long term (current) use of anticoagulants: Secondary | ICD-10-CM | POA: Insufficient documentation

## 2022-07-17 LAB — CBC
HCT: 36.3 % (ref 36.0–46.0)
Hemoglobin: 10.9 g/dL — ABNORMAL LOW (ref 12.0–15.0)
MCH: 24.2 pg — ABNORMAL LOW (ref 26.0–34.0)
MCHC: 30 g/dL (ref 30.0–36.0)
MCV: 80.7 fL (ref 80.0–100.0)
Platelets: 418 10*3/uL — ABNORMAL HIGH (ref 150–400)
RBC: 4.5 MIL/uL (ref 3.87–5.11)
RDW: 17.5 % — ABNORMAL HIGH (ref 11.5–15.5)
WBC: 6.3 10*3/uL (ref 4.0–10.5)
nRBC: 0 % (ref 0.0–0.2)

## 2022-07-17 LAB — BASIC METABOLIC PANEL
Anion gap: 9 (ref 5–15)
BUN: 14 mg/dL (ref 6–20)
CO2: 22 mmol/L (ref 22–32)
Calcium: 8.8 mg/dL — ABNORMAL LOW (ref 8.9–10.3)
Chloride: 107 mmol/L (ref 98–111)
Creatinine, Ser: 0.85 mg/dL (ref 0.44–1.00)
GFR, Estimated: >60 mL/min (ref >60)
Glucose, Bld: 79 mg/dL (ref 70–99)
Potassium: 3.6 mmol/L (ref 3.5–5.1)
Sodium: 138 mmol/L (ref 135–145)

## 2022-07-17 LAB — TROPONIN I (HIGH SENSITIVITY)
Troponin I (High Sensitivity): 2 ng/L (ref <18)
Troponin I (High Sensitivity): 2 ng/L (ref <18)

## 2022-07-17 LAB — D-DIMER, QUANTITATIVE: D-Dimer, Quant: <0.27 ug/mL-FEU (ref 0.00–0.50)

## 2022-07-17 NOTE — ED Notes (Signed)
Unable to provide urine specimen at this time.  

## 2022-07-17 NOTE — ED Notes (Signed)
Called lab to add on d-dimer.

## 2022-07-17 NOTE — ED Notes (Signed)
Pt ambulated on pulse ox. O2 maintained above 95% entire time, patient asymptomatic while walking independently with steady gait. HR got up to 110 and then came back down into 90s after walking.

## 2022-07-17 NOTE — ED Triage Notes (Signed)
Pt arrives with c/o SOB that started about a week ago. Pt has hx of PE and eliquis. Pt endorses chest discomfort. Pt denies sick contacts.

## 2022-07-18 NOTE — ED Provider Notes (Signed)
[facility] Provider Note   CSN: [id number] Arrival date & time: 07/17/22  1756     History  Chief Complaint  Patient presents with   Shortness of Breath    Norma Schmidt is a 42 y.o. female.  Patient with history of PE anticoagulated with Eliquis presents today with complaints of shortness of breath.  She states that she was originally diagnosed with a PE in August 2023.  She has been taking her Eliquis as prescribed since then without any missed doses.  Patient was supposed to follow-up with oncology for further evaluation, however her appointment was canceled last minute and rescheduled for April. She states that since her discharge from the hospital she has never felt completely back to normal and has had some residual shortness of breath. States that a little over a week ago she came down with URI symptoms which she has subsequently gotten over, however states that since then she has felt more short of breath. She states that she feels somewhat more winded when she has long conversations or walks long distances. She denies any fevers, chills, chest pain, nausea, vomiting, or abdominal pain. Denies any leg pain or leg swelling. No recent travel, recent surgeries. It was thought that her PE was precipitated by her OCP use which she has subsequently discontinued.   The history is provided by the patient. No language interpreter was used.  Shortness of Breath      Home Medications Prior to Admission medications   Medication Sig Start Date End Date Taking? Authorizing Provider  apixaban (ELIQUIS) 5 MG TABS tablet Take 1 tablet (5 mg total) by mouth 2 (two) times daily. Start after you finish starter pack 03/05/22   Mercy Riding, MD  APIXABAN Arne [place]) VTE STARTER PACK (10MG AND 5MG) Take as directed on package: start with two-64m tablets twice daily for 7 days. On day 8, switch to one-517mtablet twice daily. 02/03/22   GoMercy RidingMD   Multiple Vitamin (MULTIVITAMIN) capsule Take 1 capsule by mouth daily.    [provider]  rizatriptan (MAXALT-MLT) 10 MG disintegrating tablet Take 1 tablet earliest onset of migraine.  May repeat in 2 hours if needed.  Maximum 2 tablets in 24 hours. 05/18/21   JaPieter PartridgeDO  topiramate (TOPAMAX) 50 MG tablet Take 1 tablet (50 mg total) by mouth at bedtime. Patient not taking: Reported on 01/30/2022 05/18/21   JaPieter PartridgeDO      Allergies    Patient has no known allergies.    Review of Systems   Review of Systems  Respiratory:  Positive for shortness of breath.   All other systems reviewed and are negative.   Physical Exam Updated Vital Signs BP (!) 142/78   Pulse 88   Temp 97.7 F (36.5 C)   Resp (!) 21   Ht 5' 10"$  (1.778 m)   Wt 99.8 kg   SpO2 99%   BMI 31.57 kg/m  Physical Exam Vitals and nursing note reviewed.  Constitutional:      General: She is not in acute distress.    Appearance: Normal appearance. She is normal weight. She is not ill-appearing, toxic-appearing or diaphoretic.  HENT:     Head: Normocephalic and atraumatic.  Neck:     Vascular: No JVD.  Cardiovascular:     Rate and Rhythm: Normal rate and regular rhythm.     Heart sounds: Normal heart sounds.  Pulmonary:  Effort: Pulmonary effort is normal. No respiratory distress.     Breath sounds: Normal breath sounds.  Abdominal:     Palpations: Abdomen is soft.  Musculoskeletal:        General: Normal range of motion.     Cervical back: Normal range of motion.     Right lower leg: No tenderness. No edema.     Left lower leg: No tenderness. No edema.  Skin:    General: Skin is warm and dry.  Neurological:     General: No focal deficit present.     Mental Status: She is alert.  Psychiatric:        Mood and Affect: Mood normal.        Behavior: Behavior normal.     ED Results / Procedures / Treatments   Labs (all labs ordered are listed, but only abnormal results are  displayed) Labs Reviewed  BASIC METABOLIC PANEL - Abnormal; Notable for the following components:      Result Value   Calcium 8.8 (*)    All other components within normal limits  CBC - Abnormal; Notable for the following components:   Hemoglobin 10.9 (*)    MCH 24.2 (*)    RDW 17.5 (*)    Platelets 418 (*)    All other components within normal limits  D-DIMER, QUANTITATIVE  PREGNANCY, URINE  TROPONIN I (HIGH SENSITIVITY)  TROPONIN I (HIGH SENSITIVITY)    EKG None  Radiology DG Chest 2 View  Result Date: 07/17/2022 CLINICAL DATA:  Shortness of breath x1 week. History of PE on Eliquis. EXAM: CHEST - 2 VIEW COMPARISON:  Chest radiograph March 08, 2022. FINDINGS: The heart size and mediastinal contours are within normal limits. No focal consolidation. No pleural effusion. No pneumothorax. The visualized skeletal structures are unremarkable. IMPRESSION: No active cardiopulmonary disease. Electronically Signed   By: Dahlia Bailiff M.D.   On: 07/17/2022 18:42    Procedures Procedures    Medications Ordered in ED Medications - No data to display  ED Course/ Medical Decision Making/ A&P                             Medical Decision Making Amount and/or Complexity of Data Reviewed Labs: ordered. Radiology: ordered.   This patient is a 41 y.o. female who presents to the ED for concern of shortness of breath, this involves an extensive number of treatment options, and is a complaint that carries with it a high risk of complications and morbidity. The emergent differential diagnosis prior to evaluation includes, but is not limited to,  CHF, pericardial effusion/tamponade, arrhythmias, ACS, COPD, asthma, bronchitis, pneumonia, pneumothorax, PE, anemia  This is not an exhaustive differential.   Past Medical History / Co-morbidities / Social History: Hx PE on Elliquis  Physical Exam: Physical exam performed. The pertinent findings include: lung sounds CTA in all fields. Heart  sounds without murmurs rubs or gallops  Lab Tests: I ordered, and personally interpreted labs.  The pertinent results include:  hgb 10.9. troponin negative and flat, d dimer negative   Imaging Studies: I ordered imaging studies including CXR. I independently visualized and interpreted imaging which showed NAD. I agree with the radiologist interpretation.   Cardiac Monitoring:  The patient was maintained on a cardiac monitor.  My attending physician Dr. Rogene Houston viewed and interpreted the cardiac monitored which showed an underlying rhythm of: sinus rhythm. I agree with this interpretation.  Disposition:  Patient presents  today with complaints of shortness of breath following URI greater than 1 week ago.  She is afebrile, nontoxic-appearing, and in no acute distress with reassuring vital signs. Work-up reassuring, d dimer negative. Patient on Elliquis with no missed doses. Very low suspicion for PE. No signs of pneumonia or pneumothorax. No concern for ACS. She is able to walk throughout the department without any hypoxia, tachypnea, or tachycardia. Suspect patients symptoms are due to residual symptoms from her URI. Patient is understanding and in agreement. She feels ready to go home. Recommend close pcp follow-up. Also educated on red flag symptoms that would prompt immediate return.  Patient understanding and amenable with plan, discharged in stable condition.   Final Clinical Impression(s) / ED Diagnoses Final diagnoses:  SOB (shortness of breath)    Rx / DC Orders ED Discharge Orders     None     An After Visit Summary was printed and given to the patient.     Nestor Lewandowsky 07/18/22 [id number]    Fredia Sorrow, MD 07/19/22 [phone]

## 2022-07-18 NOTE — Discharge Instructions (Addendum)
As we discussed, your your workup in the ER today was reassuring for acute findings.  Laboratory evaluation, x-ray imaging, and EKG did not reveal any emergent concerns.  I recommend that you call your primary care doctor tomorrow to schedule an appointment for continued evaluation and management of your symptoms.  Return if development of any new or worsening symptoms.
